# Patient Record
Sex: Male | Born: 1980 | Race: White | Hispanic: No | Marital: Single | Smoking: Current every day smoker
Health system: Southern US, Community
[De-identification: ages and names within clinical notes are randomized; demographics above are authoritative.]

## PROBLEM LIST (undated history)

## (undated) DIAGNOSIS — F419 Anxiety disorder, unspecified: Secondary | ICD-10-CM

## (undated) HISTORY — DX: Anxiety disorder, unspecified: F41.9

## (undated) HISTORY — PX: FRACTURE SURGERY: SHX138

---
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
## 2003-03-25 ENCOUNTER — Encounter: Admission: RE | Admit: 2003-03-25 | Discharge: 2003-03-25 | Payer: Self-pay | Admitting: Family Medicine

## 2003-03-25 ENCOUNTER — Encounter: Payer: Self-pay | Admitting: Family Medicine

## 2006-11-20 ENCOUNTER — Emergency Department (HOSPITAL_COMMUNITY): Admission: EM | Admit: 2006-11-20 | Discharge: 2006-11-20 | Payer: Self-pay | Admitting: Emergency Medicine

## 2006-11-29 ENCOUNTER — Ambulatory Visit (HOSPITAL_BASED_OUTPATIENT_CLINIC_OR_DEPARTMENT_OTHER): Admission: RE | Admit: 2006-11-29 | Discharge: 2006-11-29 | Payer: Self-pay | Admitting: Otolaryngology

## 2013-10-28 ENCOUNTER — Emergency Department (HOSPITAL_COMMUNITY): Payer: Self-pay

## 2013-10-28 ENCOUNTER — Encounter (HOSPITAL_COMMUNITY): Payer: Self-pay | Admitting: Emergency Medicine

## 2013-10-28 ENCOUNTER — Emergency Department (HOSPITAL_COMMUNITY)
Admission: EM | Admit: 2013-10-28 | Discharge: 2013-10-28 | Disposition: A | Discharge status: Home / Self Care | Payer: Self-pay | Attending: Emergency Medicine | Admitting: Emergency Medicine

## 2013-10-28 DIAGNOSIS — Z87891 Personal history of nicotine dependence: Secondary | ICD-10-CM | POA: Insufficient documentation

## 2013-10-28 DIAGNOSIS — R51 Headache: Secondary | ICD-10-CM | POA: Insufficient documentation

## 2013-10-28 DIAGNOSIS — H9209 Otalgia, unspecified ear: Secondary | ICD-10-CM | POA: Insufficient documentation

## 2013-10-28 DIAGNOSIS — Z8619 Personal history of other infectious and parasitic diseases: Secondary | ICD-10-CM | POA: Insufficient documentation

## 2013-10-28 DIAGNOSIS — N451 Epididymitis: Secondary | ICD-10-CM

## 2013-10-28 DIAGNOSIS — N453 Epididymo-orchitis: Secondary | ICD-10-CM | POA: Insufficient documentation

## 2013-10-28 MED ORDER — CEFTRIAXONE SODIUM 250 MG IJ SOLR
250.0000 mg | Freq: Once | INTRAMUSCULAR | Status: AC
  Administered 2013-10-28: 250 mg via INTRAMUSCULAR
  Filled 2013-10-28: qty 250

## 2013-10-28 MED ORDER — DOXYCYCLINE HYCLATE 100 MG PO CAPS: 100.0000 mg | ORAL_CAPSULE | Freq: Two times a day (BID) | ORAL | Status: DC

## 2013-10-28 NOTE — ED Notes (Signed)
Pt advised this RN that he has two week hx of bilateral ear pain, 1 day hx of pain and swelling in l/testicle

## 2013-10-28 NOTE — ED Provider Notes (Signed)
CSN: [id number]     Arrival date & time 10/28/13  1453 History  This chart was scribed for non-physician practitioner Santiago Glad, PA-C, working with Suzi Roots, MD by Dorothey Baseman, ED Scribe. This patient was seen in room WTR9/WTR9 and the patient's care was started at 3:47 PM.   Chief Complaint  Patient presents with  . Testicle Pain    l/testicle pain x1 day  . Otalgia   The history is provided by the patient. No language interpreter was used.   HPI Comments: Peter Escobar is a 32 y.o. male who presents to the Emergency Department complaining of a constant pain to the bilateral ears, left worse than right, onset about 1 week ago. He states that the pain is alleviated by tugging on the ears. He reports associated headache. He denies any ear drainage, congestion, fever, or chills.   Patient is also complaining of some swelling and pain to the left testicle with associated penile discharge onset yesterday that has been progressively worsening. He reports that the pain is exacerbated with movement. He denies abdominal pain. Denies nausea or vomiting.  Patient reports that he is not currently sexually active, but has been in the past. Patient has no other pertinent medical history.  History reviewed. No pertinent past medical history. History reviewed. No pertinent past surgical history. History reviewed. No pertinent family history. History  Substance Use Topics  . Smoking status: Former Games developer  . Smokeless tobacco: Not on file  . Alcohol Use: Yes     Comment: stopped heavy drinking one week ago    Review of Systems  A complete 10 system review of systems was obtained and all systems are negative except as noted in the HPI and PMH.   Allergies  Review of patient's allergies indicates no known allergies.  Home Medications  No current outpatient prescriptions on file.  Triage Vitals: BP 137/75  Pulse 79  Temp(Src) 98.2 F (36.8 C) (Oral)  Resp 16  SpO2 98%  Physical  Exam  Nursing note and vitals reviewed. Constitutional: He is oriented to person, place, and time. He appears well-developed and well-nourished. No distress.  HENT:  Head: Normocephalic and atraumatic.  Right Ear: Hearing, tympanic membrane, external ear and ear canal normal.  Left Ear: Hearing, tympanic membrane, external ear and ear canal normal.  Mouth/Throat: Oropharynx is clear and moist. No trismus in the jaw. No dental abscesses. No oropharyngeal exudate.  Eyes: Conjunctivae are normal.  Neck: Normal range of motion. Neck supple.  Cardiovascular: Normal rate, regular rhythm and normal heart sounds.  Exam reveals no gallop and no friction rub.   No murmur heard. Pulmonary/Chest: Effort normal and breath sounds normal. No respiratory distress. He has no wheezes. He has no rales.  Abdominal: Soft. Bowel sounds are normal. He exhibits no distension. There is no tenderness.  Genitourinary: Right testis shows no mass, no swelling and no tenderness. Left testis shows swelling and tenderness. Left testis shows no mass. Circumcised. No discharge found.  No penile discharge. Tenderness to palpation to the epididymis of the left scrotum. Mild, diffuse swelling of the left scrotum. No erythema.   Musculoskeletal: Normal range of motion.  Lymphadenopathy:       Right: No inguinal adenopathy present.       Left: No inguinal adenopathy present.  Neurological: He is alert and oriented to person, place, and time.  Skin: Skin is warm and dry.  Psychiatric: He has a normal mood and affect. His behavior is normal.  ED Course  Procedures (including critical care time)  DIAGNOSTIC STUDIES: Oxygen Saturation is 98% on room air, normal by my interpretation.    COORDINATION OF CARE: 3:51 PM- Will order an ultrasound due to concern for epididymitis. Will order testing for GC/chlamydia. Discussed treatment plan with patient at bedside and patient verbalized agreement.   5:25 PM- Discussed that  ultrasound confirms that symptoms are due to epididymitis. Will order an injection of Rocephin. Will discharge patient with doxycycline to manage symptoms. Advised patient to follow up with the referred urologist. Performed GC/chlamydia swab and discussed that he will be notified of the results in a few days. Discussed treatment plan with patient at bedside and patient verbalized agreement.    Labs Review Labs Reviewed  GC/CHLAMYDIA PROBE AMP   Imaging Review US Scrotum  10/28/2013   CLINICAL DATA:  Scrotal pain and swelling.  EXAM: SCROTAL ULTRASOUND  DOPPLER ULTRASOUND OF THE TESTICLES  TECHNIQUE: Complete ultrasound examination of the testicles, epididymis, and other scrotal structures was performed. Color and spectral Doppler ultrasound were also utilized to evaluate blood flow to the testicles.  COMPARISON:  None.  FINDINGS: Right testicle  Measurements: 4.1 x 2.1 x 2.3 cm. No mass or microlithiasis visualized.  Left testicle  Measurements: 4.1 x 2.1 x 2.6 cm. No mass or microlithiasis visualized.  Right epididymis: Normal in size without focal mass. Slight increased vascularity on color Doppler evaluation.  Left epididymis: Normal in size without focal mass. Increased vascularity on color Doppler evaluation.  Hydrocele:  None visualized.  Varicocele:  None visualized.  Pulsed Doppler interrogation of both testes demonstrates low resistance arterial and venous waveforms bilaterally.  IMPRESSION: 1. No evidence for testicular mass or torsion. 2. Increased epididymal blood flow, left greater than right, consistent with epididymo-orchitis.   Electronically Signed   By: Rosalie Gums M.D.   On: 10/28/2013 16:49   [place] Art/ven Flow Abd Pelv Doppler  10/28/2013   CLINICAL DATA:  Scrotal pain and swelling.  EXAM: SCROTAL ULTRASOUND  DOPPLER ULTRASOUND OF THE TESTICLES  TECHNIQUE: Complete ultrasound examination of the testicles, epididymis, and other scrotal structures was performed. Color and spectral Doppler  ultrasound were also utilized to evaluate blood flow to the testicles.  COMPARISON:  None.  FINDINGS: Right testicle  Measurements: 4.1 x 2.1 x 2.3 cm. No mass or microlithiasis visualized.  Left testicle  Measurements: 4.1 x 2.1 x 2.6 cm. No mass or microlithiasis visualized.  Right epididymis: Normal in size without focal mass. Slight increased vascularity on color Doppler evaluation.  Left epididymis: Normal in size without focal mass. Increased vascularity on color Doppler evaluation.  Hydrocele:  None visualized.  Varicocele:  None visualized.  Pulsed Doppler interrogation of both testes demonstrates low resistance arterial and venous waveforms bilaterally.  IMPRESSION: 1. No evidence for testicular mass or torsion. 2. Increased epididymal blood flow, left greater than right, consistent with epididymo-orchitis.   Electronically Signed   By: Rosalie Gums M.D.   On: 10/28/2013 16:49    EKG Interpretation   None       MDM  No diagnosis found. Patient presenting with ear pain and also scrotal pain and swelling.  No signs of ear infection on exam.  Scrotal ultrasound showing findings consistent with epididymo-orchitis.  GC/Chlamycia pending.  Patient treated with Ceftriaxone and Doxycycline.  Patient stable for discharge.  Return precautions given.  I personally performed the services described in this documentation, which was scribed in my presence. The recorded information has been reviewed and is  accurate.     Santiago Glad, PA-C 10/30/13 1153

## 2013-10-30 NOTE — ED Notes (Signed)
+   Chlamydia Patient treated with Rocephin And doxycyline-[facility] faxed

## 2013-10-30 NOTE — ED Provider Notes (Signed)
Medical screening examination/treatment/procedure(s) were performed by non-physician practitioner and as supervising physician I was immediately available for consultation/collaboration.  EKG Interpretation   None         Candyce Churn, MD 10/30/13 1316

## 2013-11-01 ENCOUNTER — Telehealth (HOSPITAL_COMMUNITY): Payer: Self-pay | Admitting: *Deleted

## 2013-11-01 NOTE — ED Notes (Signed)
Letter sent to [facility] address 

## 2015-04-12 ENCOUNTER — Emergency Department (INDEPENDENT_AMBULATORY_CARE_PROVIDER_SITE_OTHER): Payer: Self-pay

## 2015-04-12 ENCOUNTER — Emergency Department (INDEPENDENT_AMBULATORY_CARE_PROVIDER_SITE_OTHER)
Admission: EM | Admit: 2015-04-12 | Discharge: 2015-04-12 | Disposition: A | Discharge status: Home / Self Care | Payer: Self-pay | Source: Home / Self Care | Attending: Family Medicine | Admitting: Family Medicine

## 2015-04-12 ENCOUNTER — Encounter (HOSPITAL_COMMUNITY): Payer: Self-pay | Admitting: Family Medicine

## 2015-04-12 DIAGNOSIS — S92302A Fracture of unspecified metatarsal bone(s), left foot, initial encounter for closed fracture: Secondary | ICD-10-CM

## 2015-04-12 MED ORDER — DICLOFENAC SODIUM 75 MG PO TBEC: 75.0000 mg | DELAYED_RELEASE_TABLET | Freq: Two times a day (BID) | ORAL | Status: DC

## 2015-04-12 NOTE — ED Provider Notes (Addendum)
CSN: [id number]     Arrival date & time 04/12/15  1606 History   First MD Initiated Contact with Patient 04/12/15 1615     Chief Complaint  Patient presents with  . Foot Pain   (Consider location/radiation/quality/duration/timing/severity/associated sxs/prior Treatment) HPI  L foot pain. Started 1 day ago. Top of foot. Stepped over a whole and foot went into a hole. Felt cracking and popping. Painful to walk. Ibuprofen w/ some benefit. Ice w/ some improvement. Pain is constant. No change since this morning.   Denies head trauma, LOC, lightheadedness, chest pain, palpitations, shortness of breath, other joint effusions. Sensation and movement intact in left foot. Right foot unaffected.   History reviewed. No pertinent past medical history. History reviewed. No pertinent past surgical history. Family History  Problem Relation Age of Onset  . Cancer Other    History  Substance Use Topics  . Smoking status: Former Games developermoker  . Smokeless tobacco: Not on file  . Alcohol Use: Yes     Comment: stopped heavy drinking one week ago    Review of Systems Per HPI with all other pertinent systems negative.   Allergies  Review of patient's allergies indicates no known allergies.  Home Medications   Prior to Admission medications   Not on File   BP 133/79 mmHg  Pulse 69  Temp(Src) 98 F (36.7 C) (Oral)  Resp 16  SpO2 98% Physical Exam Physical Exam  Constitutional: oriented to person, place, and time. appears well-developed and well-nourished. No distress.  HENT:  Head: Normocephalic and atraumatic.  Eyes: EOMI. PERRL.  Neck: Normal range of motion.  Cardiovascular: RRR, no m/r/g, 2+ distal pulses,  Pulmonary/Chest: Effort normal and breath sounds normal. No respiratory distress.  Abdominal: Soft. Bowel sounds are normal. NonTTP, no distension.  Musculoskeletal: Mild swelling along the proximal dorsum of the left foot with tenderness to palpation. No ankle laxity and no bony  abnormality noted. Full range of motion of the feet bilaterally. Pedal pulses intact.  Neurological: alert and oriented to person, place, and time.  Skin: Skin is warm. No rash noted. non diaphoretic.  Psychiatric: normal mood and affect. behavior is normal. Judgment and thought content normal.   ED Course  Procedures (including critical care time) Labs Review Labs Reviewed - No data to display  Imaging Review Dg Foot Complete Left  04/12/2015   CLINICAL DATA:  Left lateral foot pain secondary to an injury yesterday.  EXAM: LEFT FOOT - COMPLETE 3+ VIEW  COMPARISON:  None.  FINDINGS: There is a nondisplaced fracture of the base of the fifth metatarsal. The other bones are normal.  IMPRESSION: Nondisplaced fracture of the base of the fifth metatarsal.   Electronically Signed   By: Francene BoyersJames  Maxwell M.D.   On: 04/12/2015 17:12     MDM   1. Fracture of 5th metatarsal, left, closed, initial encounter    Fracture as noted above. Cam Walker boot, Voltaren. [facility]. Patient to call Monday to schedule appointment.    [name], MD 04/12/15 1752  [name], MD 04/12/15 [id number]

## 2015-04-12 NOTE — Discharge Instructions (Signed)
You broke your fifth metatarsal. Please wear the boot 24's 7 unless taking a shower. Please call orthopedic surgery on Monday to schedule an appointment within 1 week. Please use ibuprofen 600 mg every 6 hours or the Voltaren for pain and inflammation.

## 2015-04-12 NOTE — ED Notes (Signed)
Left foot pain. Pt was walking last outside last pm and stepped in hole and hurt foot.

## 2016-02-09 ENCOUNTER — Ambulatory Visit (INDEPENDENT_AMBULATORY_CARE_PROVIDER_SITE_OTHER): Payer: 59 | Admitting: Family Medicine

## 2016-02-09 ENCOUNTER — Other Ambulatory Visit: Payer: Self-pay | Admitting: *Deleted

## 2016-02-09 VITALS — BP 110/78 | HR 66 | Temp 98.0°F | Resp 16

## 2016-02-09 DIAGNOSIS — M67432 Ganglion, left wrist: Secondary | ICD-10-CM | POA: Diagnosis not present

## 2016-02-09 DIAGNOSIS — R1310 Dysphagia, unspecified: Secondary | ICD-10-CM

## 2016-02-09 DIAGNOSIS — K921 Melena: Secondary | ICD-10-CM

## 2016-02-09 DIAGNOSIS — H538 Other visual disturbances: Secondary | ICD-10-CM | POA: Diagnosis not present

## 2016-02-09 NOTE — Progress Notes (Signed)
By signing my name below, I, Stann Ore, attest that this documentation has been prepared under the direction and in the presence of Elvina Sidle, MD. Electronically Signed: Stann Ore, Scribe. 02/09/2016 , 5:45 PM .  Patient was seen in room 12 .   Patient ID: Peter Escobar MRN: [id number], DOB: 1981-09-10, 35 y.o. Date of Encounter: 02/09/2016  Primary Physician: No primary care provider on file.  Chief Complaint:  Chief Complaint  Patient presents with   blood in stool   cyst on left wrist   Blurred Vision    HPI:  Peter Escobar is a 35 y.o. male who presents to [facility] complaining of intermittent blood in stool with some abdominal cramping within 9-10 months. He's been reading online and became concerned. He wants a colonoscopy to be checked. He also notes taking more time with bowel movements. His uncle has Crohn's disease.   He also has some reflux noticed after eating steak and chicken.   He reports having a cyst over his left wrist. He accidentally bumped the area while at work, and it drained out. It's been coming back recently.   He's had history of breaking several facial bones. He's been having some blurred vision for a few months now.   He reports smoking and alcohol use.  He works as a Location manager.   Past Medical History  Diagnosis Date   Anxiety      Home Meds: Prior to Admission medications   Medication Sig Start Date End Date Taking? Authorizing Provider  diclofenac (VOLTAREN) 75 MG EC tablet Take 1 tablet (75 mg total) by mouth 2 (two) times daily. Patient not taking: Reported on 02/09/2016 04/12/15   Ozella Rocks, MD    Allergies: No Known Allergies  Social History   Social History   Marital Status: Single    Spouse Name: N/A   Number of Children: N/A   Years of Education: N/A   Occupational History   Not on file.   Social History Main Topics   Smoking status: Former Smoker   Smokeless  tobacco: Not on file   Alcohol Use: Yes     Comment: stopped heavy drinking one week ago   Drug Use: Not on file   Sexual Activity: Not on file   Other Topics Concern   Not on file   Social History Narrative     Review of Systems: Constitutional: negative for fever, chills, night sweats, weight changes, or fatigue  HEENT: negative for hearing loss, congestion, rhinorrhea, ST, epistaxis, or sinus pressure; positive for blurred vision Cardiovascular: negative for chest pain or palpitations Respiratory: negative for hemoptysis, wheezing, shortness of breath, or cough Abdominal: negative for abdominal pain, nausea, vomiting, diarrhea, or constipation; positive for blood in stool Dermatological: negative for rash; positive for cyst over left wrist Neurologic: negative for headache, dizziness, or syncope All other systems reviewed and are otherwise negative with the exception to those above and in the HPI.  Physical Exam:  Blood pressure 110/78, pulse 66, temperature 98 F (36.7 C), temperature source Oral, resp. rate 16, SpO2 98 %., There is no height or weight on file to calculate BMI. General: Well developed, well nourished, in no acute distress. Head: Normocephalic, atraumatic, eyes without discharge, sclera non-icteric, nares are without discharge. Bilateral auditory canals clear, TM's are without perforation, pearly grey and translucent with reflective cone of light bilaterally. Oral cavity moist, posterior pharynx without exudate, erythema, peritonsillar abscess, or post nasal drip.  Neck:  Supple. No thyromegaly. Full ROM. No lymphadenopathy. Lungs: Clear bilaterally to auscultation without wheezes, rales, or rhonchi. Breathing is unlabored. Heart: RRR with S1 S2. No murmurs, rubs, or gallops appreciated. Abdomen: Soft, non-tender, non-distended with normoactive bowel sounds. No hepatomegaly. No rebound/guarding. No obvious abdominal masses. Msk:  Strength and tone normal for  age. Extremities/Skin: Warm and dry. left dorsal wrist: 0.5cm hard nodule that is nontender consistent with ganglion cyst GU: rectal exam show no abnormalities Neuro: Alert and oriented X 3. Moves all extremities spontaneously. Gait is normal. CNII-XII grossly in tact. Psych:  Responds to questions appropriately with a normal affect.   Labs:  ASSESSMENT AND PLAN:  35 y.o. year old male with  This chart was scribed in my presence and reviewed by me personally.    ICD-9-CM ICD-10-CM   1. Hematochezia 578.1 K92.1 POCT SEDIMENTATION RATE     POCT CBC     Ambulatory referral to Gastroenterology  2. Dysphagia 787.20 R13.10 POCT SEDIMENTATION RATE     Ambulatory referral to Gastroenterology  3. Ganglion cyst of wrist, left 727.41 M67.432 Ambulatory referral to Hand Surgery  4. Blurry vision 368.8 H53.8 COMPLETE METABOLIC PANEL WITH GFR     POCT glycosylated hemoglobin (Hb A1C)      Signed, Elvina SidleKurt Lauenstein, MD 02/09/2016 5:45 PM

## 2016-02-09 NOTE — Addendum Note (Signed)
Addended by: Thelma BargeICHARDSON, Adiel Erney D on: 02/09/2016 06:03 PM   Modules accepted: Orders

## 2016-02-09 NOTE — Patient Instructions (Addendum)
We are referring you to a gastroenterologist to discuss the blood in stool to make sure you do not have ulcerative colitis.  I'm referring you to a hand surgeon regarding the ganglion cyst on her left wrist.  I am running several tests to make sure do not of diabetes or other significant problem leading to the blurry vision  I want you to take Nexium daily for the next week to stop the difficulty swallowing. He can discuss this with the gastroenterologist when you see him.  IF you received an x-ray today, you will receive an invoice from [facility]. Please contact [facility] at [phone] with questions or concerns regarding your invoice.   IF you received labwork today, you will receive an invoice from [facility]. Please contact [facility] at [phone] with questions or concerns regarding your invoice.   Our billing staff will not be able to assist you with questions regarding bills from these companies.  You will be contacted with the lab results as soon as they are available. The fastest way to get your results is to activate your My Chart account. Instructions are located on the last page of this paperwork. If you have not heard from us regarding the results in 2 weeks, please contact this office.

## 2016-02-09 NOTE — Addendum Note (Signed)
Addended by: Thelma BargeICHARDSON, Brigida Scotti D on: 02/09/2016 06:48 PM   Modules accepted: Orders

## 2016-10-25 ENCOUNTER — Emergency Department (HOSPITAL_COMMUNITY): Payer: 59

## 2016-10-25 ENCOUNTER — Emergency Department (HOSPITAL_COMMUNITY)
Admission: EM | Admit: 2016-10-25 | Discharge: 2016-10-25 | Disposition: A | Discharge status: Home / Self Care | Payer: 59 | Attending: Emergency Medicine | Admitting: Emergency Medicine

## 2016-10-25 ENCOUNTER — Encounter (HOSPITAL_COMMUNITY): Payer: Self-pay

## 2016-10-25 DIAGNOSIS — F10920 Alcohol use, unspecified with intoxication, uncomplicated: Secondary | ICD-10-CM

## 2016-10-25 DIAGNOSIS — Z79899 Other long term (current) drug therapy: Secondary | ICD-10-CM | POA: Diagnosis not present

## 2016-10-25 DIAGNOSIS — W130XXA Fall from, out of or through balcony, initial encounter: Secondary | ICD-10-CM | POA: Diagnosis not present

## 2016-10-25 DIAGNOSIS — Y939 Activity, unspecified: Secondary | ICD-10-CM | POA: Insufficient documentation

## 2016-10-25 DIAGNOSIS — S32048A Other fracture of fourth lumbar vertebra, initial encounter for closed fracture: Secondary | ICD-10-CM | POA: Insufficient documentation

## 2016-10-25 DIAGNOSIS — F10120 Alcohol abuse with intoxication, uncomplicated: Secondary | ICD-10-CM | POA: Diagnosis not present

## 2016-10-25 DIAGNOSIS — S3992XA Unspecified injury of lower back, initial encounter: Secondary | ICD-10-CM | POA: Diagnosis present

## 2016-10-25 DIAGNOSIS — R55 Syncope and collapse: Secondary | ICD-10-CM | POA: Insufficient documentation

## 2016-10-25 DIAGNOSIS — Y929 Unspecified place or not applicable: Secondary | ICD-10-CM | POA: Diagnosis not present

## 2016-10-25 DIAGNOSIS — Z87891 Personal history of nicotine dependence: Secondary | ICD-10-CM | POA: Insufficient documentation

## 2016-10-25 DIAGNOSIS — Y999 Unspecified external cause status: Secondary | ICD-10-CM | POA: Diagnosis not present

## 2016-10-25 DIAGNOSIS — S32000A Wedge compression fracture of unspecified lumbar vertebra, initial encounter for closed fracture: Secondary | ICD-10-CM

## 2016-10-25 DIAGNOSIS — M79673 Pain in unspecified foot: Secondary | ICD-10-CM

## 2016-10-25 LAB — CBC WITH DIFFERENTIAL/PLATELET
Basophils Absolute: 0 10*3/uL (ref 0.0–0.1)
Basophils Relative: 0 %
Eosinophils Absolute: 0.2 10*3/uL (ref 0.0–0.7)
Eosinophils Relative: 2 %
HEMATOCRIT: 43.6 % (ref 39.0–52.0)
HEMOGLOBIN: 15 g/dL (ref 13.0–17.0)
LYMPHS ABS: 1.9 10*3/uL (ref 0.7–4.0)
LYMPHS PCT: 19 %
MCH: 31.6 pg (ref 26.0–34.0)
MCHC: 34.4 g/dL (ref 30.0–36.0)
MCV: 92 fL (ref 78.0–100.0)
Monocytes Absolute: 0.9 10*3/uL (ref 0.1–1.0)
Monocytes Relative: 9 %
NEUTROS ABS: 7 10*3/uL (ref 1.7–7.7)
NEUTROS PCT: 70 %
Platelets: 261 10*3/uL (ref 150–400)
RBC: 4.74 MIL/uL (ref 4.22–5.81)
RDW: 12.9 % (ref 11.5–15.5)
WBC: 9.9 10*3/uL (ref 4.0–10.5)

## 2016-10-25 LAB — BASIC METABOLIC PANEL
ANION GAP: 9 (ref 5–15)
BUN: 15 mg/dL (ref 6–20)
CHLORIDE: 108 mmol/L (ref 101–111)
CO2: 23 mmol/L (ref 22–32)
Calcium: 8.7 mg/dL — ABNORMAL LOW (ref 8.9–10.3)
Creatinine, Ser: 1.25 mg/dL — ABNORMAL HIGH (ref 0.61–1.24)
GFR calc non Af Amer: >60 mL/min (ref >60)
Glucose, Bld: 92 mg/dL (ref 65–99)
Potassium: 3.9 mmol/L (ref 3.5–5.1)
SODIUM: 140 mmol/L (ref 135–145)

## 2016-10-25 LAB — ETHANOL: Alcohol, Ethyl (B): 191 mg/dL — ABNORMAL HIGH (ref <5)

## 2016-10-25 MED ORDER — KETOROLAC TROMETHAMINE 30 MG/ML IJ SOLN
30.0000 mg | Freq: Once | INTRAMUSCULAR | Status: AC
  Administered 2016-10-25: 30 mg via INTRAVENOUS
  Filled 2016-10-25: qty 1

## 2016-10-25 MED ORDER — NAPROXEN 500 MG PO TABS
500.0000 mg | ORAL_TABLET | Freq: Two times a day (BID) | ORAL | 0 refills | Status: DC
Start: 2016-10-25 — End: 2018-06-04

## 2016-10-25 MED ORDER — TRAMADOL HCL 50 MG PO TABS: 50.0000 mg | ORAL_TABLET | Freq: Four times a day (QID) | ORAL | 0 refills | Status: DC | PRN
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
## 2016-10-25 NOTE — ED Notes (Signed)
Pt uncooperative and refusing to rate pain.

## 2016-10-25 NOTE — ED Notes (Signed)
Patient transported to CT 

## 2016-10-25 NOTE — ED Notes (Signed)
Pt stood and took a few steps without difficulty
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
## 2016-10-31 ENCOUNTER — Ambulatory Visit (INDEPENDENT_AMBULATORY_CARE_PROVIDER_SITE_OTHER): Payer: 59

## 2016-10-31 ENCOUNTER — Ambulatory Visit (INDEPENDENT_AMBULATORY_CARE_PROVIDER_SITE_OTHER): Payer: 59 | Admitting: Family Medicine

## 2016-10-31 DIAGNOSIS — R0789 Other chest pain: Secondary | ICD-10-CM

## 2016-10-31 DIAGNOSIS — S32040D Wedge compression fracture of fourth lumbar vertebra, subsequent encounter for fracture with routine healing: Secondary | ICD-10-CM | POA: Diagnosis not present

## 2016-10-31 NOTE — Progress Notes (Signed)
Subjective:  By signing my name below, I, Stann Ore, attest that this documentation has been prepared under the direction and in the presence of Meredith Staggers, MD. Electronically Signed: Stann Ore, Scribe. 10/31/2016 , 4:02 PM .  Patient was seen in Room 9 .   Patient ID: Peter Escobar, male    DOB: [birth year], 35 y.o.   MRN: [id number] Chief Complaint  Patient presents with  . Other    Pt would like to have more days off work due to injury.    HPI Peter Escobar is a 35 y.o. male  Patient was seen in [facility] on Nov 28th, for a fall and subsequent back pain. He had jumped off a balcony that was 2 stories up. He had possible LOC, and alcohol consumed at the time. He had primary issue of low back pain. He had subtle compression fracture seen on CT scan of L-spine over L4. His T-spine CT was without fracture. And CT C-spine and head were negative for acute intracranial or cervical spine injury. He was able to ambulate without problem in the ER. He was treated with tramadol and naproxen. He was to follow up with Dr. Eulah Pont at [facility] orthopedics.   Patient denies knowledge to follow up with Dr. Eulah Pont. He thought he had to follow up with a PCP, and tried to make an appointment at [facility]. He reports not filling tramadol yet, as he had muscle relaxants from home and 2 tablets of ibuprofen 4~5 times a day. He noticed some improvement yesterday so he's now taking 2 tablets of ibuprofen 3 times a day.   He mentions having some chest wall pain a few days after his ER visit. He notices it more when turning, located over the sternal area. He had chest xray in the ER which showed an old deformity of the posterior lateral aspect of left 4th rib. He also notes right foot pain, but he had xray done in ER which showed negative right foot xray. He denies hematuria, stool or urinary incontinence.   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   There are no active problems to display for this patient.  Past Medical History:  Diagnosis Date  . Anxiety    Past Surgical History:  Procedure Laterality Date  . FRACTURE SURGERY     No Known Allergies Prior to Admission medications   Medication Sig Start Date End Date Taking? Authorizing Provider  ibuprofen (ADVIL,MOTRIN) 600 MG tablet Take 600 mg by mouth every 6 (six) hours as needed.   Yes Historical Provider, MD  diclofenac (VOLTAREN) 75 MG EC tablet Take 1 tablet (75 mg total) by mouth 2 (two) times daily. Patient not taking: Reported on 10/31/2016 04/12/15   Ozella Rocks, MD  naproxen (NAPROSYN) 500 MG tablet Take 1 tablet (500 mg total) by mouth 2 (two) times daily. Patient not taking: Reported on 10/31/2016 10/25/16   Rolan Bucco, MD  traMADol (ULTRAM) 50 MG tablet Take 1 tablet (50 mg total) by mouth every 6 (six) hours as needed. Patient not taking: Reported on 10/31/2016 10/25/16   Rolan Bucco, MD   Social History   Social History  . Marital status: Single    Spouse name: N/A  . Number of children: N/A  . Years of education: N/A   Occupational History  . Not on file.   Social History Main Topics  . Smoking status: Former Games developer  . Smokeless tobacco: Never  Used  . Alcohol use Yes     Comment: stopped heavy drinking one week ago  . Drug use: Unknown  . Sexual activity: Not on file   Other Topics Concern  . Not on file   Social History Narrative  . No narrative on file   Review of Systems  Constitutional: Negative for fatigue and unexpected weight change.  Eyes: Negative for visual disturbance.  Respiratory: Negative for cough, chest tightness and shortness of breath.   Cardiovascular: Positive for chest pain. Negative for palpitations and leg swelling.  Gastrointestinal: Negative for abdominal pain and blood in stool.  Genitourinary: Negative for hematuria.  Musculoskeletal: Positive for back pain, gait problem and myalgias.    Neurological: Negative for dizziness, light-headedness and headaches.       Objective:   Physical Exam  Constitutional: He is oriented to person, place, and time. He appears well-developed and well-nourished. No distress.  HENT:  Head: Normocephalic and atraumatic.  Eyes: EOM are normal. Pupils are equal, round, and reactive to light.  Neck: Neck supple.  Cardiovascular: Normal rate.   Pulmonary/Chest: Effort normal. No respiratory distress.  Chest wall: skin intact, no subcutaneous emphysema, tender over lower sternal border and right sided chest wall; tender along right sided chest wall mid axillary line  Musculoskeletal: Normal range of motion.  Tender along lower lumbar spine and paraspinals, guarded with heel walk on the right, but strength intact  Neurological: He is alert and oriented to person, place, and time.  Skin: Skin is warm and dry.  Psychiatric: He has a normal mood and affect. His behavior is normal.  Nursing note and vitals reviewed.   Vitals:   10/31/16 1459  BP: 124/82  Pulse: 99  Resp: 17  Temp: 98.4 F (36.9 C)  TempSrc: Oral  SpO2: 99%  Weight: 223 lb (101.2 kg)  Height: 5\' 9"  (1.753 m)   Dg Ribs Unilateral W/chest Right  Result Date: 10/31/2016 CLINICAL DATA:  Fall. EXAM: RIGHT RIBS AND CHEST - 3+ VIEW COMPARISON:  10/31/2016. FINDINGS: No evidence of displaced rib fracture or pneumothorax. No acute cardiopulmonary disease identified . IMPRESSION: No evidence of displaced rib fracture or pneumothorax. Electronically Signed   By: Maisie Fushomas  Register   On: 10/31/2016 16:20   Dg Sternum  Result Date: 10/31/2016 CLINICAL DATA:  Sternal pain.  Fall 15 to 20 feet 10/25/2016 EXAM: STERNUM - 2+ VIEW COMPARISON:  None. FINDINGS: There is no evidence of fracture or other focal bone lesions. IMPRESSION: Negative. Electronically Signed   By: Marlan Palauharles  Clark M.D.   On: 10/31/2016 16:19       Assessment & Plan:    Peter Escobar is a 35 y.o. male Closed  compression fracture of L4 lumbar vertebra with routine healing, subsequent encounter - Plan: Ambulatory referral to Orthopedic Surgery  Sternal pain - Plan: DG Sternum, Ambulatory referral to Orthopedic Surgery  Right-sided chest wall pain - Plan: DG Ribs Unilateral W/Chest Right  Injury as above, initial evaluation ER with lumbar L4 compression fracture. No apparent fractures otherwise. X-rays of sternum and right rib series without apparent fractures in office. Suspected rib contusion. Also prior foot pain, but negative x-ray. Due to compression fracture lumbar spine and persistent pain will follow-up with orthopedics as initially planned. Appointment was obtained for him on Wednesday 11/02/16. Continue NSAIDs for now, has tramadol prescription if needed. Additional work note given until evaluated by orthopedics, RTC precautions.  Meds ordered this encounter  Medications  . ibuprofen (ADVIL,MOTRIN) 600 MG tablet  Sig: Take 600 mg by mouth every 6 (six) hours as needed.   Patient Instructions    For chest wall, back, and rib pain can take either the naproxen that was prescribed in the emergency room, or ibuprofen up to 600 mg every 6 hours as needed with food. Follow-up with orthopedics this Wednesday morning to determine next step in the treatment of your compression fracture. I did write a note out of work for the next 2 days, but you may be able to return to seated work at the discretion of orthopedist. There were no signs of fracture on the sternum x-ray or the rib x-rays on the right. Return to the clinic or go to the nearest emergency room if any of your symptoms worsen or new symptoms occur.   Vertebral Fracture A vertebral fracture is a break in one of the bones that make up the spine (vertebrae). The vertebrae are stacked on top of each other to form the spinal column. They support the body and protect the spinal cord. The vertebral column has an upper part (cervical spine), a middle  part (thoracic spine), and a lower part (lumbar spine). Most vertebral fractures occur in the thoracic spine or lumbar spine. There are three main types of vertebral fractures:  Flexion fracture. This happens when vertebrae collapse. Vertebrae can collapse:  In the front (compression fracture). This type of fracture is common in people who have a condition that causes their bones to be weak and brittle (osteoporosis). The fracture can make a person lose height.  In the front and back (axial burst fracture).  Extension fracture. This happens when an external force pulls apart the vertebrae.  Rotation fracture. This happens when the spine bends extremely in one direction. This type can cause a piece of a vertebra to break off (transverse process fracture) or move out of its normal position (fracture dislocation). This type of fracture has a high risk for spinal cord injury. Vertebral fractures can range from mild to very severe. The most severe types are those that cause the broken bones to move out of place (unstable) and those that injure or press on the spinal cord. What are the causes? This condition is usually caused by a forceful injury. This type of injury commonly results from:  Car accidents.  Falling or jumping from a great height.  Collisions in contact sports.  Violent acts, such as an assault or a gunshot wound. What increases the risk? This injury is more likely to happen to people who:  Have osteoporosis.  Participate in contact sports.  Are in situations that could result in falls or other violent injuries. What are the signs or symptoms? Symptoms of this injury depend on the location and the type of fracture. The most common symptom is back pain that gets worse with movement. You may also have trouble standing or walking. If a fracture has damaged your spinal cord or is pressing on it, you may also have:  Numbness.  Tingling.  Weakness.  Loss of  movement.  Loss of bowel or bladder control. How is this diagnosed? This injury may be diagnosed based on symptoms, medical history, and a physical exam. You may also have imaging tests to confirm the diagnosis. These may include:  Spine X-ray.  CT scan.  MRI. How is this treated? Treatment for this injury depends on the type of fracture. If your fracture is stable and does not affect your spinal cord, it may heal with nonsurgical treatment, such  as:  Taking pain medicine.  Wearing a cast or a brace.  Doing physical therapy exercises. If your vertebral fracture is unstable or it affects your spinal cord, you may need surgical treatment, such as:  Laminectomy. This procedure involves removing the part of a vertebra that is pushing on the spinal cord (spinal decompression surgery). Bone fragments may also be removed.  Spinal fusion. This procedure is used to stabilize an unstable fracture. Vertebrae may be joined together with a piece of bone from another part of your body (graft) and held in place with rods, plates, or screws.  Vertebroplasty. In this procedure, bone cement is used to rebuild collapsed vertebrae. Follow these instructions at home: General instructions  Take medicines only as directed by your health care provider.  Do not drive or operate heavy machinery while taking pain medicine.  If directed, apply ice to the injured area:  Put ice in a plastic bag.  Place a towel between your skin and the bag.  Leave the ice on for 30 minutes every two hours at first. Then apply the ice as needed.  Wear your neck brace or back brace as directed by your health care provider.  Do not drink alcohol. Alcohol can interfere with your treatment.  Keep all follow-up visits as directed by your health care provider. This is important. It can help to prevent permanent injury, disability, and long-lasting (chronic) pain. Activity  Stay in bed (on bed rest) only as directed by  your health care provider. Being on bed rest for too long can make your condition worse.  Return to your normal activities as directed by your health care provider. Ask what activities are safe for you.  Do exercises to improve motion and strength in your back (physical therapy), as recommended by your health care provider.  Exercise regularly as directed by your health care provider. Contact a health care provider if:  You have a fever.  You develop a cough that makes your pain worse.  Your pain medicine is not helping.  Your pain does not get better over time.  You cannot return to your normal activities as planned or expected. Get help right away if:  Your pain is very bad and it suddenly gets worse.  You are unable to move any body part (paralysis) that is below the level of your injury.  You have numbness, tingling, or weakness in any body part that is below the level of your injury.  You cannot control your bladder or bowels. This information is not intended to replace advice given to you by your health care provider. Make sure you discuss any questions you have with your health care provider. Document Released: 12/22/2004 Document Revised: 04/27/2016 Document Reviewed: 11/19/2014 Elsevier Interactive Patient Education  2017 [employer]vier Inc.  Chest Wall Pain Chest wall pain is pain in or around the bones and muscles of your chest. Sometimes, an injury causes this pain. Sometimes, the cause may not be known. This pain may take several weeks or longer to get better. Follow these instructions at home: Pay attention to any changes in your symptoms. Take these actions to help with your pain:  Rest as told by your health care provider.  Avoid activities that cause pain. These include any activities that use your chest muscles or your abdominal and side muscles to lift heavy items.  If directed, apply ice to the painful area:  Put ice in a plastic bag.  Place a towel between  your skin and  the bag.  Leave the ice on for 20 minutes, 2-3 times per day.  Take over-the-counter and prescription medicines only as told by your health care provider.  Do not use tobacco products, including cigarettes, chewing tobacco, and e-cigarettes. If you need help quitting, ask your health care provider.  Keep all follow-up visits as told by your health care provider. This is important. Contact a health care provider if:  You have a fever.  Your chest pain becomes worse.  You have new symptoms. Get help right away if:  You have nausea or vomiting.  You feel sweaty or light-headed.  You have a cough with phlegm (sputum) or you cough up blood.  You develop shortness of breath. This information is not intended to replace advice given to you by your health care provider. Make sure you discuss any questions you have with your health care provider. Document Released: 11/14/2005 Document Revised: 03/24/2016 Document Reviewed: 02/09/2015 Elsevier Interactive Patient Education  2017 [employer]   IF you received an x-ray today, you will receive an invoice from [facility]. Please contact [facility] at [phone] with questions or concerns regarding your invoice.   IF you received labwork today, you will receive an invoice from [facility]. Please contact [facility] at [phone] with questions or concerns regarding your invoice.   Our billing staff will not be able to assist you with questions regarding bills from these companies.  You will be contacted with the lab results as soon as they are available. The fastest way to get your results is to activate your My Chart account. Instructions are located on the last page of this paperwork. If you have not heard from us regarding the results in 2 weeks, please contact this office.     You have an appointment Wednesday 11/02/2016 at 8:45 am at [facility]  [address], [place], [place] [address] [phone]                          .Teod[facility] Medic[facility]gsac

## 2016-10-31 NOTE — Patient Instructions (Addendum)
For chest wall, back, and rib pain can take either the naproxen that was prescribed in the emergency room, or ibuprofen up to 600 mg every 6 hours as needed with food. Follow-up with orthopedics this Wednesday morning to determine next step in the treatment of your compression fracture. I did write a note out of work for the next 2 days, but you may be able to return to seated work at the discretion of orthopedist. There were no signs of fracture on the sternum x-ray or the rib x-rays on the right. Return to the clinic or go to the nearest emergency room if any of your symptoms worsen or new symptoms occur.   Vertebral Fracture A vertebral fracture is a break in one of the bones that make up the spine (vertebrae). The vertebrae are stacked on top of each other to form the spinal column. They support the body and protect the spinal cord. The vertebral column has an upper part (cervical spine), a middle part (thoracic spine), and a lower part (lumbar spine). Most vertebral fractures occur in the thoracic spine or lumbar spine. There are three main types of vertebral fractures:  Flexion fracture. This happens when vertebrae collapse. Vertebrae can collapse:  In the front (compression fracture). This type of fracture is common in people who have a condition that causes their bones to be weak and brittle (osteoporosis). The fracture can make a person lose height.  In the front and back (axial burst fracture).  Extension fracture. This happens when an external force pulls apart the vertebrae.  Rotation fracture. This happens when the spine bends extremely in one direction. This type can cause a piece of a vertebra to break off (transverse process fracture) or move out of its normal position (fracture dislocation). This type of fracture has a high risk for spinal cord injury. Vertebral fractures can range from mild to very severe. The most severe types are those that cause the broken bones to move out of  place (unstable) and those that injure or press on the spinal cord. What are the causes? This condition is usually caused by a forceful injury. This type of injury commonly results from:  Car accidents.  Falling or jumping from a great height.  Collisions in contact sports.  Violent acts, such as an assault or a gunshot wound. What increases the risk? This injury is more likely to happen to people who:  Have osteoporosis.  Participate in contact sports.  Are in situations that could result in falls or other violent injuries. What are the signs or symptoms? Symptoms of this injury depend on the location and the type of fracture. The most common symptom is back pain that gets worse with movement. You may also have trouble standing or walking. If a fracture has damaged your spinal cord or is pressing on it, you may also have:  Numbness.  Tingling.  Weakness.  Loss of movement.  Loss of bowel or bladder control. How is this diagnosed? This injury may be diagnosed based on symptoms, medical history, and a physical exam. You may also have imaging tests to confirm the diagnosis. These may include:  Spine X-ray.  CT scan.  MRI. How is this treated? Treatment for this injury depends on the type of fracture. If your fracture is stable and does not affect your spinal cord, it may heal with nonsurgical treatment, such as:  Taking pain medicine.  Wearing a cast or a brace.  Doing physical therapy exercises. If your vertebral  fracture is unstable or it affects your spinal cord, you may need surgical treatment, such as:  Laminectomy. This procedure involves removing the part of a vertebra that is pushing on the spinal cord (spinal decompression surgery). Bone fragments may also be removed.  Spinal fusion. This procedure is used to stabilize an unstable fracture. Vertebrae may be joined together with a piece of bone from another part of your body (graft) and held in place with  rods, plates, or screws.  Vertebroplasty. In this procedure, bone cement is used to rebuild collapsed vertebrae. Follow these instructions at home: General instructions  Take medicines only as directed by your health care provider.  Do not drive or operate heavy machinery while taking pain medicine.  If directed, apply ice to the injured area:  Put ice in a plastic bag.  Place a towel between your skin and the bag.  Leave the ice on for 30 minutes every two hours at first. Then apply the ice as needed.  Wear your neck brace or back brace as directed by your health care provider.  Do not drink alcohol. Alcohol can interfere with your treatment.  Keep all follow-up visits as directed by your health care provider. This is important. It can help to prevent permanent injury, disability, and long-lasting (chronic) pain. Activity  Stay in bed (on bed rest) only as directed by your health care provider. Being on bed rest for too long can make your condition worse.  Return to your normal activities as directed by your health care provider. Ask what activities are safe for you.  Do exercises to improve motion and strength in your back (physical therapy), as recommended by your health care provider.  Exercise regularly as directed by your health care provider. Contact a health care provider if:  You have a fever.  You develop a cough that makes your pain worse.  Your pain medicine is not helping.  Your pain does not get better over time.  You cannot return to your normal activities as planned or expected. Get help right away if:  Your pain is very bad and it suddenly gets worse.  You are unable to move any body part (paralysis) that is below the level of your injury.  You have numbness, tingling, or weakness in any body part that is below the level of your injury.  You cannot control your bladder or bowels. This information is not intended to replace advice given to you by  your health care provider. Make sure you discuss any questions you have with your health care provider. Document Released: 12/22/2004 Document Revised: 04/27/2016 Document Reviewed: 11/19/2014 Elsevier Interactive Patient Education  2017 [employer]  Chest Wall Pain Chest wall pain is pain in or around the bones and muscles of your chest. Sometimes, an injury causes this pain. Sometimes, the cause may not be known. This pain may take several weeks or longer to get better. Follow these instructions at home: Pay attention to any changes in your symptoms. Take these actions to help with your pain:  Rest as told by your health care provider.  Avoid activities that cause pain. These include any activities that use your chest muscles or your abdominal and side muscles to lift heavy items.  If directed, apply ice to the painful area:  Put ice in a plastic bag.  Place a towel between your skin and the bag.  Leave the ice on for 20 minutes, 2-3 times per day.  Take over-the-counter and prescription medicines  only as told by your health care provider.  Do not use tobacco products, including cigarettes, chewing tobacco, and e-cigarettes. If you need help quitting, ask your health care provider.  Keep all follow-up visits as told by your health care provider. This is important. Contact a health care provider if:  You have a fever.  Your chest pain becomes worse.  You have new symptoms. Get help right away if:  You have nausea or vomiting.  You feel sweaty or light-headed.  You have a cough with phlegm (sputum) or you cough up blood.  You develop shortness of breath. This information is not intended to replace advice given to you by your health care provider. Make sure you discuss any questions you have with your health care provider. Document Released: 11/14/2005 Document Revised: 03/24/2016 Document Reviewed: 02/09/2015 Elsevier Interactive Patient Education  2017 [employer]   IF you received an x-ray today, you will receive an invoice from [facility]. Please contact [facility] at [phone] with questions or concerns regarding your invoice.   IF you received labwork today, you will receive an invoice from [facility]. Please contact [facility] at [phone] with questions or concerns regarding your invoice.   Our billing staff will not be able to assist you with questions regarding bills from these companies.  You will be contacted with the lab results as soon as they are available. The fastest way to get your results is to activate your My Chart account. Instructions are located on the last page of this paperwork. If you have not heard from us regarding the results in 2 weeks, please contact this office.     You have an appointment Wednesday 11/02/2016 at 8:45 am at [facility]  [address], [place], [place] [address] [phone]

## 2018-04-05 ENCOUNTER — Ambulatory Visit: Payer: 59 | Admitting: Family Medicine

## 2018-04-12 ENCOUNTER — Ambulatory Visit: Payer: 59 | Admitting: Adult Health

## 2018-06-04 ENCOUNTER — Encounter: Payer: Self-pay | Admitting: Adult Health

## 2018-06-04 ENCOUNTER — Ambulatory Visit (INDEPENDENT_AMBULATORY_CARE_PROVIDER_SITE_OTHER): Payer: BLUE CROSS/BLUE SHIELD | Admitting: Adult Health

## 2018-06-04 ENCOUNTER — Encounter: Payer: Self-pay | Admitting: Family Medicine

## 2018-06-04 VITALS — BP 116/67 | HR 61 | Ht 69.0 in | Wt 207.2 lb

## 2018-06-04 DIAGNOSIS — Z0000 Encounter for general adult medical examination without abnormal findings: Secondary | ICD-10-CM | POA: Insufficient documentation

## 2018-06-04 DIAGNOSIS — F419 Anxiety disorder, unspecified: Secondary | ICD-10-CM | POA: Insufficient documentation

## 2018-06-04 DIAGNOSIS — F909 Attention-deficit hyperactivity disorder, unspecified type: Secondary | ICD-10-CM | POA: Diagnosis not present

## 2018-06-04 DIAGNOSIS — R4789 Other speech disturbances: Secondary | ICD-10-CM | POA: Insufficient documentation

## 2018-06-04 DIAGNOSIS — F429 Obsessive-compulsive disorder, unspecified: Secondary | ICD-10-CM | POA: Insufficient documentation

## 2018-06-04 NOTE — Assessment & Plan Note (Signed)
Significantly pressured rate of speech and racing thoughts. Referral to psychiatry placed. 

## 2018-06-04 NOTE — Progress Notes (Signed)
Subjective:    Patient ID: Peter Escobar, male    DOB: Dec 20, 1980, 37 y.o.   MRN: [id number]  HPI:  Peter Escobar is here to establish as a new pt.  He is pleasant 37 year old male.  PMH: Excessive ETOH use- consumes "bottle a whiskey most nights if the week", and Polysubstance abuse in past, however currently denies illicit drug use. He denies ETOH causing problems with his employment or interpersonal relationships.  He reports DUI in early 2000s He reports that his mother "told me I was treated for OCD and ADD in the third grade, but then we moved and I went to a different doctor". He has not been on medications since that time nor can he recall what he was on. He denies CP/dyspnea/palpitations/dizziness/tremors. He denies depression or thoughts if harming himself/others. He reports "a few different people in my life told me I need a primary doctor, that's why I am here". He reports racing thoughts and "thinking about 5 things at once".  He states "I talked to my boss in his office today and he told me that I re-arranged his desk while we talked, I had no idea that I did that". He is a single father of 5 children ages 21, 50, 70, 87, 1 He can work 10-15 hrs/day at an [employer] center, then come home and "do the dishes, take out the trash, and build a trampoline". He reports going for weeks with little sleep, est 3-4 hrs/night then will "crash hard for about a week". He denies hx of bi-polar He denies auditory/visual hallucinations He smokes 1/2 pack to full pack/day, declined smoking cessation' He drinks 1/5 liquor most nights/week He denies regular exercise  Patient Care Team    Relationship Specialty Notifications Start End  Julaine Fusi, NP PCP - General Family Medicine  06/04/18     Patient Active Problem List   Diagnosis Date Noted  . Abnormal rate of speech 06/04/2018  . Anxiety 06/04/2018  . Attention deficit hyperactivity disorder (ADHD) 06/04/2018  .  Obsessive-compulsive disorder 06/04/2018  . Healthcare maintenance 06/04/2018   Past Medical History:  Diagnosis Date  . Anxiety      Past Surgical History:  Procedure Laterality Date  . FRACTURE SURGERY       Family History  Problem Relation Age of Onset  . Cancer Other   . Diabetes Maternal Grandmother      Social History   Substance and Sexual Activity  Drug Use Not Currently     Social History   Substance and Sexual Activity  Alcohol Use Yes   Comment: pt states that he cannot determine how much he drinks per week     Social History   Tobacco Use  Smoking Status Current Every Day Smoker  . Packs/day: 0.50  . Years: 14.00  . Pack years: 7.00  Smokeless Tobacco Never Used     Outpatient Encounter Medications as of 06/04/2018  Medication Sig  . [DISCONTINUED] ibuprofen (ADVIL,MOTRIN) 600 MG tablet Take 600 mg by mouth every 6 (six) hours as needed.  . [DISCONTINUED] naproxen (NAPROSYN) 500 MG tablet Take 1 tablet (500 mg total) by mouth 2 (two) times daily. (Patient not taking: Reported on 10/31/2016)   No facility-administered encounter medications on file as of 06/04/2018.     Allergies: Patient has no known allergies.  Body mass index is 30.6 kg/m.  Blood pressure 116/67, pulse 61, height 5\' 9"  (1.753 m), weight 207 lb 3.2 oz (94 kg), SpO2  98 %.  Review of Systems  Constitutional: Negative for activity change, appetite change, chills, diaphoresis, fatigue, fever and unexpected weight change.  HENT: Negative for congestion.   Eyes: Negative for visual disturbance.  Respiratory: Negative for cough, chest tightness, shortness of breath, wheezing and stridor.   Cardiovascular: Negative for chest pain, palpitations and leg swelling.  Gastrointestinal: Negative for abdominal distention, abdominal pain, blood in stool, constipation, diarrhea, nausea, rectal pain and vomiting.  Endocrine: Negative for cold intolerance, heat intolerance, polydipsia,  polyphagia and polyuria.  Genitourinary: Negative for difficulty urinating and frequency.  Musculoskeletal: Negative for arthralgias, back pain, gait problem, joint swelling, myalgias, neck pain and neck stiffness.  Skin: Negative for color change, pallor, rash and wound.  Neurological: Positive for headaches. Negative for dizziness.  Hematological: Does not bruise/bleed easily.  Psychiatric/Behavioral: Positive for decreased concentration. Negative for agitation, behavioral problems, confusion, dysphoric mood, hallucinations, self-injury, sleep disturbance and suicidal ideas. The patient is nervous/anxious and is hyperactive.       Objective:   Physical Exam  Constitutional: He appears well-developed and well-nourished. No distress.  HENT:  Head: Normocephalic and atraumatic.  Right Ear: External ear normal.  Left Ear: External ear normal.  Nose: Nose normal.  Mouth/Throat: Oropharynx is clear and moist.  Cardiovascular: Normal rate, regular rhythm, normal heart sounds and intact distal pulses.  No murmur heard. Pulmonary/Chest: Effort normal and breath sounds normal. No stridor. No respiratory distress. He has no wheezes. He has no rales. He exhibits no tenderness.  Neurological: He is alert.  Skin: Skin is warm and dry. Capillary refill takes less than 2 seconds. No rash noted. He is not diaphoretic. No erythema. No pallor.  Psychiatric: Thought content normal. His mood appears anxious. His speech is rapid and/or pressured. He is hyperactive. Cognition and memory are normal.      Assessment & Plan:   1. Obsessive-compulsive disorder, unspecified type   2. Attention deficit hyperactivity disorder (ADHD), unspecified ADHD type   3. Anxiety   4. Abnormal rate of speech   5. Healthcare maintenance     Healthcare maintenance  Increase water intake, strive for at least 100 ounces/day.   Follow Heart Healthy diet Increase regular exercise.  Recommend at least 30 minutes daily, 5  days per week of walking, jogging, biking, swimming, YouTube/Pinterest workout videos. Reduce-stop tobacco use. Dramatically reduce alcohol intake. Referral to Psychiatry placed, re: OCD, ADHD Recommend complete physical with fasting labs in next 3-6 months.  Obsessive-compulsive disorder Significantly pressured rate of speech and racing thoughts. Referral to psychiatry placed.  Attention deficit hyperactivity disorder (ADHD) Significantly pressured rate of speech and racing thoughts. Referral to psychiatry placed.  Abnormal rate of speech Pressured/rapid speech Racing thoughts    FOLLOW-UP:  Return in about 3 months (around 09/04/2018) for CPE, Fasting Labs.

## 2018-06-04 NOTE — Assessment & Plan Note (Signed)
Pressured/rapid speech Racing thoughts

## 2018-06-04 NOTE — Assessment & Plan Note (Signed)
  Increase water intake, strive for at least 100 ounces/day.   Follow Heart Healthy diet Increase regular exercise.  Recommend at least 30 minutes daily, 5 days per week of walking, jogging, biking, swimming, YouTube/Pinterest workout videos. Reduce-stop tobacco use. Dramatically reduce alcohol intake. Referral to Psychiatry placed, re: OCD, ADHD Recommend complete physical with fasting labs in next 3-6 months.

## 2018-06-04 NOTE — Assessment & Plan Note (Signed)
Significantly pressured rate of speech and racing thoughts. Referral to psychiatry placed.

## 2018-06-04 NOTE — Patient Instructions (Addendum)
Mediterranean Diet A Mediterranean diet refers to food and lifestyle choices that are based on the traditions of countries located on the [place]. This way of eating has been shown to help prevent certain conditions and improve outcomes for people who have chronic diseases, like kidney disease and heart disease. What are tips for following this plan? Lifestyle  Cook and eat meals together with your family, when possible.  Drink enough fluid to keep your urine clear or pale yellow.  Be physically active every day. This includes: ? Aerobic exercise like running or swimming. ? Leisure activities like gardening, walking, or housework.  Get 7-8 hours of sleep each night.  If recommended by your health care provider, drink red wine in moderation. This means 1 glass a day for nonpregnant women and 2 glasses a day for men. A glass of wine equals 5 oz (150 mL). Reading food labels  Check the serving size of packaged foods. For foods such as rice and pasta, the serving size refers to the amount of cooked product, not dry.  Check the total fat in packaged foods. Avoid foods that have saturated fat or trans fats.  Check the ingredients list for added sugars, such as corn syrup. Shopping  At the grocery store, buy most of your food from the areas near the walls of the store. This includes: ? Fresh fruits and vegetables (produce). ? Grains, beans, nuts, and seeds. Some of these may be available in unpackaged forms or large amounts (in bulk). ? Fresh seafood. ? Poultry and eggs. ? Low-fat dairy products.  Buy whole ingredients instead of prepackaged foods.  Buy fresh fruits and vegetables in-season from local farmers markets.  Buy frozen fruits and vegetables in resealable bags.  If you do not have access to quality fresh seafood, buy precooked frozen shrimp or canned fish, such as tuna, salmon, or sardines.  Buy small amounts of raw or cooked vegetables, salads, or olives from the  deli or salad bar at your store.  Stock your pantry so you always have certain foods on hand, such as olive oil, canned tuna, canned tomatoes, rice, pasta, and beans. Cooking  Cook foods with extra-virgin olive oil instead of using butter or other vegetable oils.  Have meat as a side dish, and have vegetables or grains as your main dish. This means having meat in small portions or adding small amounts of meat to foods like pasta or stew.  Use beans or vegetables instead of meat in common dishes like chili or lasagna.  Experiment with different cooking methods. Try roasting or broiling vegetables instead of steaming or sauteing them.  Add frozen vegetables to soups, stews, pasta, or rice.  Add nuts or seeds for added healthy fat at each meal. You can add these to yogurt, salads, or vegetable dishes.  Marinate fish or vegetables using olive oil, lemon juice, garlic, and fresh herbs. Meal planning  Plan to eat 1 vegetarian meal one day each week. Try to work up to 2 vegetarian meals, if possible.  Eat seafood 2 or more times a week.  Have healthy snacks readily available, such as: ? Vegetable sticks with hummus. ? Greek yogurt. ? Fruit and nut trail mix.  Eat balanced meals throughout the week. This includes: ? Fruit: 2-3 servings a day ? Vegetables: 4-5 servings a day ? Low-fat dairy: 2 servings a day ? Fish, poultry, or lean meat: 1 serving a day ? Beans and legumes: 2 or more servings a week ? Nuts   and seeds: 1-2 servings a day ? Whole grains: 6-8 servings a day ? Extra-virgin olive oil: 3-4 servings a day  Limit red meat and sweets to only a few servings a month What are my food choices?  Mediterranean diet ? Recommended ? Grains: Whole-grain pasta. Brown rice. Bulgar wheat. Polenta. Couscous. Whole-wheat bread. Orpah Cobb. ? Vegetables: Artichokes. Beets. Broccoli. Cabbage. Carrots. Eggplant. Green beans. Chard. Kale. Spinach. Onions. Leeks. Peas. Squash.  Tomatoes. Peppers. Radishes. ? Fruits: Apples. Apricots. Avocado. Berries. Bananas. Cherries. Dates. Figs. Grapes. Lemons. Melon. Oranges. Peaches. Plums. Pomegranate. ? Meats and other protein foods: Beans. Almonds. Sunflower seeds. Pine nuts. Peanuts. Cod. Salmon. Scallops. Shrimp. Tuna. Tilapia. Clams. Oysters. Eggs. ? Dairy: Low-fat milk. Cheese. Greek yogurt. ? Beverages: Water. Red wine. Herbal tea. ? Fats and oils: Extra virgin olive oil. Avocado oil. Grape seed oil. ? Sweets and desserts: [place] yogurt with honey. Baked apples. Poached pears. Trail mix. ? Seasoning and other foods: Basil. Cilantro. Coriander. Cumin. Mint. Parsley. Sage. Rosemary. Tarragon. Garlic. Oregano. Thyme. Pepper. Balsalmic vinegar. Tahini. Hummus. Tomato sauce. Olives. Mushrooms. ? Limit these ? Grains: Prepackaged pasta or rice dishes. Prepackaged cereal with added sugar. ? Vegetables: Deep fried potatoes (french fries). ? Fruits: Fruit canned in syrup. ? Meats and other protein foods: Beef. Pork. Lamb. Poultry with skin. Hot dogs. Tomasa Blase. ? Dairy: Ice cream. Sour cream. Whole milk. ? Beverages: Juice. Sugar-sweetened soft drinks. Beer. Liquor and spirits. ? Fats and oils: Butter. Canola oil. Vegetable oil. Beef fat (tallow). Lard. ? Sweets and desserts: Cookies. Cakes. Pies. Candy. ? Seasoning and other foods: Mayonnaise. Premade sauces and marinades. ? The items listed may not be a complete list. Talk with your dietitian about what dietary choices are right for you. Summary  The Mediterranean diet includes both food and lifestyle choices.  Eat a variety of fresh fruits and vegetables, beans, nuts, seeds, and whole grains.  Limit the amount of red meat and sweets that you eat.  Talk with your health care provider about whether it is safe for you to drink red wine in moderation. This means 1 glass a day for nonpregnant women and 2 glasses a day for men. A glass of wine equals 5 oz (150 mL). This information  is not intended to replace advice given to you by your health care provider. Make sure you discuss any questions you have with your health care provider. Document Released: 07/07/2016 Document Revised: 08/09/2016 Document Reviewed: 07/07/2016 Elsevier Interactive Patient Education  2018 [employer]   Obsessive-Compulsive Disorder Obsessive-compulsive disorder (OCD) is a brain-based anxiety disorder. People with OCD have obsessions, compulsions, or both. Obsessions are unwanted and distressing thoughts, ideas, or urges that keep entering your mind and result in anxiety. You may find yourself trying to ignore them. You may try to stop or undo them with a compulsion. Compulsions are repetitive physical or mental acts that you feel you have to do. They may reduce or prevent any emotional distress, but in most instances, they are ineffective. Compulsions can be very time-consuming, often taking more than one hour each day. They can interfere with personal relationships and normal activities at home, school, or work. OCD can begin in childhood, but it usually starts in young adulthood and continues throughout life. Many people with OCD also have depression or another mental health disorder. What are the causes? The cause of this condition is not known. What increases the risk? This condition is more like to develop in:  People who have experienced trauma.  People who have a family history of OCD.  Women during and after pregnancy.  People who have infections and post-infectious autoimmune syndrome.  People who have other mental health conditions.  People who abuse substances.  What are the signs or symptoms? Symptoms of OCD include compulsions and obsessions. People with obsessions usually have a fear that something terrible will happen or that they will do something terrible. Examples of common obsessions include:  Fear of contamination with germs, waste, or poisonous substances.  Fear  of making the wrong decision.  Violent or sexual thoughts or urges towards others.  Need for symmetry or exactness.  Examples of common compulsions include:  Excessive handwashing or bathing due to fear of contamination.  Checking things over and over to make sure you finished a task, such as making sure you locked a door or unplugged a toaster.  Repeating an act or phrase over and over, sometimes a specific number of times, until it feels right.  Arranging and rearranging objects to keep them in a certain order.  Having a very hard time making a decision and sticking to it.  How is this diagnosed? OCD is diagnosed through an assessment by your health care provider. Your health care provider will ask questions about any obsessions or compulsions you have and how they affect your life. Your health care provider may also ask about your medical history, prescription medicines, and drug use. Certain medical conditions and substances can cause symptoms that are similar to OCD. Your health care provider may also refer you to a mental health specialist. How is this treated? Treatment may include:  Cognitive therapy. This is a form of talk therapy. The goal is to identify and change the irrational thoughts associated with obsessions.  Behavioral therapy. A type of behavioral therapy called exposure and response prevention is often used. In this therapy, you will be exposed to the distressing situation that triggers your compulsion and be prevented from responding to it. With repetition of this process over time, you will no longer feel the distress or need to perform the compulsion.  Self-soothing. Meditation, deep breathing, or yoga can help you manage the physiological symptoms of anxiety and can help with how you think.  Medicine. Certain types of antidepressant medicine may help reduce or control OCD symptoms. Medicine is most effective when used with cognitive or behavioral  therapy.  Treatment usually involves a combination of therapy and medicines. For severe OCD that does not respond to talk therapy and medicine, brain surgery or electrical stimulation of specific areas of the brain (deep brain stimulation) may be considered. Follow these instructions at home:  Take over-the-counter and prescription medicines only as told by your health care provider. Do not start taking any new medicines with approval from your health care provider.  Consider joining a support group for people with OCD.  Keep all follow-up visits as told by your health care provider. This is important. Contact a health care provider if:  You are not able to take your medicines as prescribed.  Your symptoms get worse. Get help right away if:  You have suicidal thoughts or thoughts about hurting yourself or others. If you ever feel like you may hurt yourself or others, or have thoughts about taking your own life, get help right away. You can go to your nearest emergency department or call:  Your local emergency services (911 in the [place]  A suicide crisis helpline, such as the National Suicide Prevention Lifeline at 914-596-6842. This  is open 24-hours a day.  Summary  Obsessive-compulsive disorder (OCD) is a brain-based anxiety disorder. People with OCD have obsessions, compulsions, or both.  OCD can interfere with personal relationships and normal activities at home, school, or work.  Treatment usually involves a combination of therapy and medicines. This information is not intended to replace advice given to you by your health care provider. Make sure you discuss any questions you have with your health care provider. Document Released: 11/08/2001 Document Revised: 08/29/2016 Document Reviewed: 08/29/2016 Elsevier Interactive Patient Education  2018 [employer]   Living With Attention Deficit Hyperactivity Disorder If you have been diagnosed with attention deficit  hyperactivity disorder (ADHD), you may be relieved that you now know why you have felt or behaved a certain way. Still, you may feel overwhelmed about the treatment ahead. You may also wonder how to get the support you need and how to deal with the condition day-to-day. With treatment and support, you can live with ADHD and manage your symptoms. How to manage lifestyle changes Managing stress Stress is your body's reaction to life changes and events, both good and bad. To cope with the stress of an ADHD diagnosis, it may help to:  Learn more about ADHD.  Exercise regularly. Even a short daily walk can lower stress levels.  Participate in training or education programs (including social skills training classes) that teach you to deal with symptoms.  Medicines Your health care provider may suggest certain medicines if he or she feels that they will help to improve your condition. Stimulant medicines are usually prescribed to treat ADHD, and therapy may also be prescribed. It is important to:  Avoid using alcohol and other substances that may prevent your medicines from working properly [facility](mayinteract).  Talk with your pharmacist or health care provider about all the medicines that you take, their possible side effects, and what medicines are safe to take together.  Make it your goal to take part in all treatment decisions (shared decision-making). Ask about possible side effects of medicines that your health care provider recommends, and tell him or her how you feel about having those side effects. It is best if shared decision-making with your health care provider is part of your total treatment plan.  Relationships To strengthen your relationships with family members while treating your condition, consider taking part in family therapy. You might also attend self-help groups alone or with a loved one. Be honest about how your symptoms affect your relationships. Make an effort to communicate  respectfully instead of fighting, and find ways to show others that you care. Psychotherapy may be useful in helping you cope with how ADHD affects your relationships. How to recognize changes in your condition The following signs may mean that your treatment is working well and your condition is improving:  Consistently being on time for appointments.  Being more organized at home and work.  Other people noticing improvements in your behavior.  Achieving goals that you set for yourself.  Thinking more clearly.  The following signs may mean that your treatment is not working very well:  Feeling impatience or more confusion.  Missing, forgetting, or being late for appointments.  An increasing sense of disorganization and messiness.  More difficulty in reaching goals that you set for yourself.  Loved ones becoming angry or frustrated with you.  Where to find support Talking to others  Keep emotion out of important discussions and speak in a calm, logical way.  Listen closely and patiently  to your loved ones. Try to understand their point of view, and try to avoid getting defensive.  Take responsibility for the consequences of your actions.  Ask that others do not take your behaviors personally.  Aim to solve problems as they come up, and express your feelings instead of bottling them up.  Talk openly about what you need from your loved ones and how they can support you.  Consider going to family therapy sessions or having your family meet with a specialist who deals with ADHD-related behavior problems. Finances Not all insurance plans cover mental health care, so it is important to check with your insurance carrier. If paying for co-pays or counseling services is a problem, search for a local or county mental health care center. Public mental health care services may be offered there at a low cost or no cost when you are not able to see a private health care provider. If you  are taking medicine for ADHD, you may be able to get the generic form, which may be less expensive than brand-name medicine. Some makers of prescription medicines also offer help to patients who cannot afford the medicines that they need. Follow these instructions at home:  Take over-the-counter and prescription medicines only as told by your health care provider. Check with your health care provider before taking any new medicines.  Create structure and an organized atmosphere at home. For example: ? Make a list of tasks, then rank them from most important to least important. Work on one task at a time until your listed tasks are done. ? Make a daily schedule and follow it consistently every day. ? Use an appointment calendar, and check it 2 or 3 times a day to keep on track. Keep it with you when you leave the house. ? Create spaces where you keep certain things, and always put things back in their places after you use them.  Keep all follow-up visits as told by your health care provider. This is important. Questions to ask your health care provider:  What are the risks and benefits of taking medicines?  Would I benefit from therapy?  How often should I follow up with a health care provider? Contact a health care provider if:  You have side effects from your medicines, such as: ? Repeated muscle twitches, coughing, or speech outbursts. ? Sleep problems. ? Loss of appetite. ? Depression. ? New or worsening behavior problems. ? Dizziness. ? Unusually fast heartbeat. ? Stomach pains. ? Headaches. Get help right away if:  You have a severe reaction to a medicine.  Your behavior suddenly gets worse. Summary  With treatment and support, you can live with ADHD and manage your symptoms.  The medicines that are most often prescribed for ADHD are stimulants.  Consider taking part in family therapy or self-help groups with family members or friends.  When you talk with friends and  family about your ADHD, be patient and communicate openly.  Take over-the-counter and prescription medicines only as told by your health care provider. Check with your health care provider before taking any new medicines. This information is not intended to replace advice given to you by your health care provider. Make sure you discuss any questions you have with your health care provider. Document Released: 03/16/2017 Document Revised: 03/16/2017 Document Reviewed: 03/16/2017 Elsevier Interactive Patient Education  2018 [employer].  Increase water intake, strive for at least 100 ounces/day.   Follow Heart Healthy diet Increase regular exercise.  Recommend  at least 30 minutes daily, 5 days per week of walking, jogging, biking, swimming, YouTube/Pinterest workout videos. Reduce-stop tobacco use. Dramatically reduce alcohol intake. Referral to Psychiatry placed, re: OCD, ADHD Recommend complete physical with fasting labs in next 3-6 months. WELCOME TO THE PRACTICE!

## 2018-06-05 ENCOUNTER — Encounter: Payer: Self-pay | Admitting: Adult Health

## 2018-07-04 ENCOUNTER — Ambulatory Visit (INDEPENDENT_AMBULATORY_CARE_PROVIDER_SITE_OTHER): Payer: BLUE CROSS/BLUE SHIELD | Admitting: Psychology

## 2018-07-04 DIAGNOSIS — F418 Other specified anxiety disorders: Secondary | ICD-10-CM | POA: Diagnosis not present

## 2018-07-25 ENCOUNTER — Ambulatory Visit: Payer: Self-pay | Admitting: Psychology

## 2018-12-19 ENCOUNTER — Encounter: Payer: BLUE CROSS/BLUE SHIELD | Admitting: Adult Health

## 2019-02-08 ENCOUNTER — Other Ambulatory Visit: Payer: Self-pay

## 2019-02-08 ENCOUNTER — Other Ambulatory Visit: Payer: BLUE CROSS/BLUE SHIELD

## 2019-02-08 ENCOUNTER — Encounter: Payer: Self-pay | Admitting: Adult Health

## 2019-02-08 DIAGNOSIS — Z0000 Encounter for general adult medical examination without abnormal findings: Secondary | ICD-10-CM

## 2019-02-08 NOTE — Addendum Note (Signed)
Addended by: Stan Head on: 02/08/2019 08:46 AM   Modules accepted: Orders

## 2019-02-09 LAB — COMPREHENSIVE METABOLIC PANEL
ALBUMIN: 4.3 g/dL (ref 4.0–5.0)
ALK PHOS: 66 IU/L (ref 39–117)
ALT: 115 IU/L — ABNORMAL HIGH (ref 0–44)
AST: 60 IU/L — AB (ref 0–40)
Albumin/Globulin Ratio: 2 (ref 1.2–2.2)
BILIRUBIN TOTAL: 0.5 mg/dL (ref 0.0–1.2)
BUN / CREAT RATIO: 17 (ref 9–20)
BUN: 18 mg/dL (ref 6–20)
CHLORIDE: 105 mmol/L (ref 96–106)
CO2: 19 mmol/L — AB (ref 20–29)
CREATININE: 1.05 mg/dL (ref 0.76–1.27)
Calcium: 8.9 mg/dL (ref 8.7–10.2)
GFR calc Af Amer: 106 mL/min/{1.73_m2} (ref >59)
GFR calc non Af Amer: 92 mL/min/{1.73_m2} (ref >59)
Globulin, Total: 2.2 g/dL (ref 1.5–4.5)
Glucose: 90 mg/dL (ref 65–99)
Potassium: 4.8 mmol/L (ref 3.5–5.2)
SODIUM: 139 mmol/L (ref 134–144)
Total Protein: 6.5 g/dL (ref 6.0–8.5)

## 2019-02-09 LAB — CBC WITH DIFFERENTIAL/PLATELET
BASOS ABS: 0.1 10*3/uL (ref 0.0–0.2)
BASOS: 2 %
EOS (ABSOLUTE): 0.5 10*3/uL — AB (ref 0.0–0.4)
Eos: 9 %
Hematocrit: 42.7 % (ref 37.5–51.0)
Hemoglobin: 14.6 g/dL (ref 13.0–17.7)
IMMATURE GRANS (ABS): 0 10*3/uL (ref 0.0–0.1)
IMMATURE GRANULOCYTES: 0 %
LYMPHS: 35 %
Lymphocytes Absolute: 2.1 10*3/uL (ref 0.7–3.1)
MCH: 31.5 pg (ref 26.6–33.0)
MCHC: 34.2 g/dL (ref 31.5–35.7)
MCV: 92 fL (ref 79–97)
Monocytes Absolute: 0.9 10*3/uL (ref 0.1–0.9)
Monocytes: 14 %
NEUTROS PCT: 40 %
Neutrophils Absolute: 2.4 10*3/uL (ref 1.4–7.0)
Platelets: 228 10*3/uL (ref 150–450)
RBC: 4.64 x10E6/uL (ref 4.14–5.80)
RDW: 12.4 % (ref 11.6–15.4)
WBC: 6 10*3/uL (ref 3.4–10.8)

## 2019-02-09 LAB — LIPID PANEL
CHOL/HDL RATIO: 3.5 ratio (ref 0.0–5.0)
Cholesterol, Total: 195 mg/dL (ref 100–199)
HDL: 55 mg/dL (ref >39)
LDL CALC: 115 mg/dL — AB (ref 0–99)
Triglycerides: 125 mg/dL (ref 0–149)
VLDL Cholesterol Cal: 25 mg/dL (ref 5–40)

## 2019-02-09 LAB — HEMOGLOBIN A1C
Est. average glucose Bld gHb Est-mCnc: 103 mg/dL
HEMOGLOBIN A1C: 5.2 % (ref 4.8–5.6)

## 2019-02-09 LAB — TSH: TSH: 1.66 u[IU]/mL (ref 0.450–4.500)

## 2019-02-10 NOTE — Progress Notes (Addendum)
[facility]  Subjective:    Patient ID: Peter Escobar, male    DOB: [birth year], 38 y.o.   MRN: [id number]  HPI:06/04/2018 OV:   [name] is here to establish as a new pt.  He is pleasant 38 year old male.  PMH: Excessive ETOH use- consumes "bottle a whiskey most nights if the week", and Polysubstance abuse in past, however currently denies illicit drug use. He denies ETOH causing problems with his employment or interpersonal relationships.  He reports DUI in early 2000s He reports that his mother "told me I was treated for OCD and ADD in the third grade, but then we moved and I went to a different doctor". He has not been on medications since that time nor can he recall what he was on. He denies CP/dyspnea/palpitations/dizziness/tremors. He denies depression or thoughts if harming himself/others. He reports "a few different people in my life told me I need a primary doctor, that's why I am here". He reports racing thoughts and "thinking about 5 things at once".  He states "I talked to my boss in his office today and he told me that I re-arranged his desk while we talked, I had no idea that I did that". He is a single father of 5 children ages 43, 38, 69, 81, 1 He can work 10-15 hrs/day at an [employer] center, then come home and "do the dishes, take out the trash, and build a trampoline". He reports going for weeks with little sleep, est 3-4 hrs/night then will "crash hard for about a week". He denies hx of bi-polar He denies auditory/visual hallucinations He smokes 1/2 pack to full pack/day, declined smoking cessation' He drinks 1/5 liquor most nights/week He denies regular exercise   02/14/2019 OV: Peter Escobar is here for CPE He reports smoking 10 cigarettes/day, he declined tobacco cessation today He continues to consume ETOH heavily,up to 1/5 of Liquor/night He denies ETOH interfering with personal relationships or negatively impacting work. He denies drinking in the morning or being asked  to cut back by family/friends He denies any regular exercise and has gained 30 lbs since last OV July 2019 Current wt 237 He reports "chicken and steak getting stuck in throat", occurring now 4-5 years- he as never been evaluated by GI He also reports intermittent painful "bumps" at base of hairline on posterior cervical neck- on/off the last 5 months- none at present  Reviewed Recent Lab Results: TSH-WNL, 1.660 A1c-WNL, 5.2 CBC-stable CMP-LFT elevations, likely r/t ETOH use Long discussion on long term effects of heavy ETOH use and negative effects on CV, Neuro, GI systems  Healthcare Maintenance: Immunizations-UTD   Patient Care Team    Relationship Specialty Notifications Start End  Julaine Fusi, NP PCP - General Family Medicine  06/04/18     Patient Active Problem List   Diagnosis Date Noted  . Elevated LFTs 02/14/2019  . Dysphagia 02/14/2019  . Abnormal rate of speech 06/04/2018  . Anxiety 06/04/2018  . Attention deficit hyperactivity disorder (ADHD) 06/04/2018  . Obsessive-compulsive disorder 06/04/2018  . Healthcare maintenance 06/04/2018   Past Medical History:  Diagnosis Date  . Anxiety      Past Surgical History:  Procedure Laterality Date  . FRACTURE SURGERY       Family History  Problem Relation Age of Onset  . Cancer Other   . Diabetes Maternal Grandmother      Social History   Substance and Sexual Activity  Drug Use Not Currently     Social  History   Substance and Sexual Activity  Alcohol Use Yes   Comment: pt states that he cannot determine how much he drinks per week     Social History   Tobacco Use  Smoking Status Current Every Day Smoker  . Packs/day: 0.50  . Years: 14.00  . Pack years: 7.00  Smokeless Tobacco Never Used     No outpatient encounter medications on file as of 02/14/2019.   No facility-administered encounter medications on file as of 02/14/2019.     Allergies: Patient has no known allergies.  Body mass  index is 35.57 kg/m.  Blood pressure 112/68, pulse 85, temperature 98.6 F (37 C), temperature source Oral, height 5' 8.5" (1.74 m), weight 237 lb 6.4 oz (107.7 kg), SpO2 95 %.  Review of Systems  Constitutional: Negative for activity change, appetite change, chills, diaphoresis, fatigue, fever and unexpected weight change.  HENT: Negative for congestion.   Eyes: Negative for visual disturbance.  Respiratory: Negative for cough, chest tightness, shortness of breath, wheezing and stridor.   Cardiovascular: Negative for chest pain, palpitations and leg swelling.  Gastrointestinal: Negative for abdominal distention, abdominal pain, blood in stool, constipation, diarrhea, nausea, rectal pain and vomiting.  Endocrine: Negative for cold intolerance, heat intolerance, polydipsia, polyphagia and polyuria.  Genitourinary: Negative for difficulty urinating and frequency.  Musculoskeletal: Negative for arthralgias, back pain, gait problem, joint swelling, myalgias, neck pain and neck stiffness.  Skin: Negative for color change, pallor, rash and wound.  Neurological: Positive for headaches. Negative for dizziness.  Hematological: Does not bruise/bleed easily.  Psychiatric/Behavioral: Positive for decreased concentration. Negative for agitation, behavioral problems, confusion, dysphoric mood, hallucinations, self-injury, sleep disturbance and suicidal ideas. The patient is nervous/anxious and is hyperactive.       Objective:   Physical Exam Vitals signs and nursing note reviewed.  Constitutional:      General: He is not in acute distress.    Appearance: He is well-developed. He is not diaphoretic.  HENT:     Head: Normocephalic and atraumatic.     Right Ear: Tympanic membrane, ear canal and external ear normal. There is no impacted cerumen.     Left Ear: Tympanic membrane, ear canal and external ear normal. There is no impacted cerumen.     Nose: Nose normal. No congestion or rhinorrhea.      Mouth/Throat:     Mouth: Mucous membranes are dry.     Pharynx: Oropharynx is clear.  Eyes:     Extraocular Movements: Extraocular movements intact.     Conjunctiva/sclera: Conjunctivae normal.     Pupils: Pupils are equal, round, and reactive to light.  Neck:     Musculoskeletal: Normal range of motion. No muscular tenderness.  Cardiovascular:     Rate and Rhythm: Normal rate and regular rhythm.     Heart sounds: Normal heart sounds. No murmur.  Pulmonary:     Effort: Pulmonary effort is normal. No respiratory distress.     Breath sounds: Normal breath sounds. No stridor. No wheezing or rales.  Chest:     Chest wall: No tenderness.  Abdominal:     General: Bowel sounds are normal. There is no distension.     Palpations: There is no mass.     Tenderness: There is no abdominal tenderness. There is no right CVA tenderness, left CVA tenderness, guarding or rebound.     Hernia: No hernia is present.  Musculoskeletal: Normal range of motion.        General: No tenderness.  Lymphadenopathy:     Cervical: No cervical adenopathy.  Skin:    General: Skin is warm and dry.     Capillary Refill: Capillary refill takes less than 2 seconds.     Coloration: Skin is not pale.     Findings: No erythema or rash.  Neurological:     Mental Status: He is alert and oriented to person, place, and time.  Psychiatric:        Mood and Affect: Mood is anxious.        Speech: Speech is rapid and pressured.        Behavior: Behavior is hyperactive.        Thought Content: Thought content normal.       Assessment & Plan:   1. Dysphagia, unspecified type   2. Elevated LFTs   3. Healthcare maintenance     Healthcare maintenance Reduce alcohol use- try to keep to maximum 2 drinks/day Follow Mediterranean Diet and resume regular exercise.  Recommend at least 30 minutes daily, 5 days per week of walking, jogging, biking, swimming, YouTube/Pinterest workout videos. Referral to Gastroenterology placed:  difficulty swallowing. Reduce to stop tobacco use- you can do it. Please schedule lab appt in 6 months, re: elevated Liver Function Recommend annual physical, fasting lab appt the week prior.  Dysphagia Difficult to swallow chicken/steak the lat 4/5 years Referral to GI placed  Elevated LFTs CMP 02/09/2019- AST 60 (0-40) ALT 115 (0-44) Advised to at least reduce ETOH by 50%, ideally no more than 2 ETOH drinks/day Re-check CMP in 6 months    FOLLOW-UP:  Return in about 1 year (around 02/14/2020).

## 2019-02-14 ENCOUNTER — Ambulatory Visit (INDEPENDENT_AMBULATORY_CARE_PROVIDER_SITE_OTHER): Payer: BLUE CROSS/BLUE SHIELD | Admitting: Adult Health

## 2019-02-14 ENCOUNTER — Other Ambulatory Visit: Payer: Self-pay

## 2019-02-14 ENCOUNTER — Encounter: Payer: Self-pay | Admitting: Adult Health

## 2019-02-14 VITALS — BP 112/68 | HR 85 | Temp 98.6°F | Ht 68.5 in | Wt 237.4 lb

## 2019-02-14 DIAGNOSIS — R1310 Dysphagia, unspecified: Secondary | ICD-10-CM

## 2019-02-14 DIAGNOSIS — Z0000 Encounter for general adult medical examination without abnormal findings: Secondary | ICD-10-CM

## 2019-02-14 DIAGNOSIS — R7989 Other specified abnormal findings of blood chemistry: Secondary | ICD-10-CM

## 2019-02-14 DIAGNOSIS — R945 Abnormal results of liver function studies: Secondary | ICD-10-CM

## 2019-02-14 NOTE — Patient Instructions (Addendum)
Preventive Care for Adults, Male A healthy lifestyle and preventive care can promote health and wellness. Preventive health guidelines for men include the following key practices:  A routine yearly physical is a good way to check with your health care provider about your health and preventative screening. It is a chance to share any concerns and updates on your health and to receive a thorough exam.  Visit your dentist for a routine exam and preventative care every 6 months. Brush your teeth twice a day and floss once a day. Good oral hygiene prevents tooth decay and gum disease.  The frequency of eye exams is based on your age, health, family medical history, use of contact lenses, and other factors. Follow your health care provider's recommendations for frequency of eye exams.  Eat a healthy diet. Foods such as vegetables, fruits, whole grains, low-fat dairy products, and lean protein foods contain the nutrients you need without too many calories. Decrease your intake of foods high in solid fats, added sugars, and salt. Eat the right amount of calories for you.Get information about a proper diet from your health care provider, if necessary.  Regular physical exercise is one of the most important things you can do for your health. Most adults should get at least 150 minutes of moderate-intensity exercise (any activity that increases your heart rate and causes you to sweat) each week. In addition, most adults need muscle-strengthening exercises on 2 or more days a week.  Maintain a healthy weight. The body mass index (BMI) is a screening tool to identify possible weight problems. It provides an estimate of body fat based on height and weight. Your health care provider can find your BMI and can help you achieve or maintain a healthy weight.For adults 20 years and older:  A BMI below 18.5 is considered underweight.  A BMI of 18.5 to 24.9 is normal.  A BMI of 25 to 29.9 is considered  overweight.  A BMI of 30 and above is considered obese.  Maintain normal blood lipids and cholesterol levels by exercising and minimizing your intake of saturated fat. Eat a balanced diet with plenty of fruit and vegetables. Blood tests for lipids and cholesterol should begin at age 55 and be repeated every 5 years. If your lipid or cholesterol levels are high, you are over 50, or you are at high risk for heart disease, you may need your cholesterol levels checked more frequently.Ongoing high lipid and cholesterol levels should be treated with medicines if diet and exercise are not working.  If you smoke, find out from your health care provider how to quit. If you do not use tobacco, do not start.  Lung cancer screening is recommended for adults aged 90-80 years who are at high risk for developing lung cancer because of a history of smoking. A yearly low-dose CT scan of the lungs is recommended for people who have at least a 30-pack-year history of smoking and are a current smoker or have quit within the past 15 years. A pack year of smoking is smoking an average of 1 pack of cigarettes a day for 1 year (for example: 1 pack a day for 30 years or 2 packs a day for 15 years). Yearly screening should continue until the smoker has stopped smoking for at least 15 years. Yearly screening should be stopped for people who develop a health problem that would prevent them from having lung cancer treatment.  If you choose to drink alcohol, do not have more  than 2 drinks per day. One drink is considered to be 12 ounces (355 mL) of beer, 5 ounces (148 mL) of wine, or 1.5 ounces (44 mL) of liquor.  Avoid use of street drugs. Do not share needles with anyone. Ask for help if you need support or instructions about stopping the use of drugs.  High blood pressure causes heart disease and increases the risk of stroke. Your blood pressure should be checked at least every 1-2 years. Ongoing high blood pressure should be  treated with medicines, if weight loss and exercise are not effective.  If you are 34-90 years old, ask your health care provider if you should take aspirin to prevent heart disease.  Diabetes screening is done by taking a blood sample to check your blood glucose level after you have not eaten for a certain period of time (fasting). If you are not overweight and you do not have risk factors for diabetes, you should be screened once every 3 years starting at age 35. If you are overweight or obese and you are 70-84 years of age, you should be screened for diabetes every year as part of your cardiovascular risk assessment.  Colorectal cancer can be detected and often prevented. Most routine colorectal cancer screening begins at the age of 18 and continues through age 69. However, your health care provider may recommend screening at an earlier age if you have risk factors for colon cancer. On a yearly basis, your health care provider may provide home test kits to check for hidden blood in the stool. Use of a small camera at the end of a tube to directly examine the colon (sigmoidoscopy or colonoscopy) can detect the earliest forms of colorectal cancer. Talk to your health care provider about this at age 71, when routine screening begins. Direct exam of the colon should be repeated every 5-10 years through age 18, unless early forms of precancerous polyps or small growths are found.  People who are at an increased risk for hepatitis B should be screened for this virus. You are considered at high risk for hepatitis B if:  You were born in a country where hepatitis B occurs often. Talk with your health care provider about which countries are considered high risk.  Your parents were born in a high-risk country and you have not received a shot to protect against hepatitis B (hepatitis B vaccine).  You have HIV or AIDS.  You use needles to inject street drugs.  You live with, or have sex with, someone who  has hepatitis B.  You are a man who has sex with other men (MSM).  You get hemodialysis treatment.  You take certain medicines for conditions such as cancer, organ transplantation, and autoimmune conditions.  Hepatitis C blood testing is recommended for all people born from [birth year] through 1965 and any individual with known risks for hepatitis C.  Practice safe sex. Use condoms and avoid high-risk sexual practices to reduce the spread of sexually transmitted infections (STIs). STIs include gonorrhea, chlamydia, syphilis, trichomonas, herpes, HPV, and human immunodeficiency virus (HIV). Herpes, HIV, and HPV are viral illnesses that have no cure. They can result in disability, cancer, and death.  If you are a man who has sex with other men, you should be screened at least once per year for:  HIV.  Urethral, rectal, and pharyngeal infection of gonorrhea, chlamydia, or both.  If you are at risk of being infected with HIV, it is recommended that you take a  prescription medicine daily to prevent HIV infection. This is called preexposure prophylaxis (PrEP). You are considered at risk if:  You are a man who has sex with other men (MSM) and have other risk factors.  You are a heterosexual man, are sexually active, and are at increased risk for HIV infection.  You take drugs by injection.  You are sexually active with a partner who has HIV.  Talk with your health care provider about whether you are at high risk of being infected with HIV. If you choose to begin PrEP, you should first be tested for HIV. You should then be tested every 3 months for as long as you are taking PrEP.  A one-time screening for abdominal aortic aneurysm (AAA) and surgical repair of large AAAs by ultrasound are recommended for men ages 44 to 66 years who are current or former smokers.  Healthy men should no longer receive prostate-specific antigen (PSA) blood tests as part of routine cancer screening. Talk with your health  care provider about prostate cancer screening.  Testicular cancer screening is not recommended for adult males who have no symptoms. Screening includes self-exam, a health care provider exam, and other screening tests. Consult with your health care provider about any symptoms you have or any concerns you have about testicular cancer.  Use sunscreen. Apply sunscreen liberally and repeatedly throughout the day. You should seek shade when your shadow is shorter than you. Protect yourself by wearing long sleeves, pants, a wide-brimmed hat, and sunglasses year round, whenever you are outdoors.  Once a month, do a whole-body skin exam, using a mirror to look at the skin on your back. Tell your health care provider about new moles, moles that have irregular borders, moles that are larger than a pencil eraser, or moles that have changed in shape or color.  Stay current with required vaccines (immunizations).  Influenza vaccine. All adults should be immunized every year.  Tetanus, diphtheria, and acellular pertussis (Td, Tdap) vaccine. An adult who has not previously received Tdap or who does not know his vaccine status should receive 1 dose of Tdap. This initial dose should be followed by tetanus and diphtheria toxoids (Td) booster doses every 10 years. Adults with an unknown or incomplete history of completing a 3-dose immunization series with Td-containing vaccines should begin or complete a primary immunization series including a Tdap dose. Adults should receive a Td booster every 10 years.  Varicella vaccine. An adult without evidence of immunity to varicella should receive 2 doses or a second dose if he has previously received 1 dose.  Human papillomavirus (HPV) vaccine. Males aged 11-21 years who have not received the vaccine previously should receive the 3-dose series. Males aged 22-26 years may be immunized. Immunization is recommended through the age of 23 years for any male who has sex with males  and did not get any or all doses earlier. Immunization is recommended for any person with an immunocompromised condition through the age of 72 years if he did not get any or all doses earlier. During the 3-dose series, the second dose should be obtained 4-8 weeks after the first dose. The third dose should be obtained 24 weeks after the first dose and 16 weeks after the second dose.  Zoster vaccine. One dose is recommended for adults aged 23 years or older unless certain conditions are present.  Measles, mumps, and rubella (MMR) vaccine. Adults born before [birth year] generally are considered immune to measles and mumps. Adults born in [birth year]  or later should have 1 or more doses of MMR vaccine unless there is a contraindication to the vaccine or there is laboratory evidence of immunity to each of the three diseases. A routine second dose of MMR vaccine should be obtained at least 28 days after the first dose for students attending postsecondary schools, health care workers, or international travelers. People who received inactivated measles vaccine or an unknown type of measles vaccine during 1963-1967 should receive 2 doses of MMR vaccine. People who received inactivated mumps vaccine or an unknown type of mumps vaccine before 1979 and are at high risk for mumps infection should consider immunization with 2 doses of MMR vaccine. Unvaccinated health care workers born before [birth year] who lack laboratory evidence of measles, mumps, or rubella immunity or laboratory confirmation of disease should consider measles and mumps immunization with 2 doses of MMR vaccine or rubella immunization with 1 dose of MMR vaccine.  Pneumococcal 13-valent conjugate (PCV13) vaccine. When indicated, a person who is uncertain of his immunization history and has no record of immunization should receive the PCV13 vaccine. All adults 9 years of age and older should receive this vaccine. An adult aged 69 years or older who has certain medical  conditions and has not been previously immunized should receive 1 dose of PCV13 vaccine. This PCV13 should be followed with a dose of pneumococcal polysaccharide (PPSV23) vaccine. Adults who are at high risk for pneumococcal disease should obtain the PPSV23 vaccine at least 8 weeks after the dose of PCV13 vaccine. Adults older than 36 years of age who have normal immune system function should obtain the PPSV23 vaccine dose at least 1 year after the dose of PCV13 vaccine.  Pneumococcal polysaccharide (PPSV23) vaccine. When PCV13 is also indicated, PCV13 should be obtained first. All adults aged 79 years and older should be immunized. An adult younger than age 43 years who has certain medical conditions should be immunized. Any person who resides in a nursing home or long-term care facility should be immunized. An adult smoker should be immunized. People with an immunocompromised condition and certain other conditions should receive both PCV13 and PPSV23 vaccines. People with human immunodeficiency virus (HIV) infection should be immunized as soon as possible after diagnosis. Immunization during chemotherapy or radiation therapy should be avoided. Routine use of PPSV23 vaccine is not recommended for American Indians, [place] Natives, or people younger than 65 years unless there are medical conditions that require PPSV23 vaccine. When indicated, people who have unknown immunization and have no record of immunization should receive PPSV23 vaccine. One-time revaccination 5 years after the first dose of PPSV23 is recommended for people aged 19-64 years who have chronic kidney failure, nephrotic syndrome, asplenia, or immunocompromised conditions. People who received 1-2 doses of PPSV23 before age 70 years should receive another dose of PPSV23 vaccine at age 79 years or later if at least 5 years have passed since the previous dose. Doses of PPSV23 are not needed for people immunized with PPSV23 at or after age 55  years.  Meningococcal vaccine. Adults with asplenia or persistent complement component deficiencies should receive 2 doses of quadrivalent meningococcal conjugate (MenACWY-D) vaccine. The doses should be obtained at least 2 months apart. Microbiologists working with certain meningococcal bacteria, [employer] recruits, people at risk during an outbreak, and people who travel to or live in countries with a high rate of meningitis should be immunized. A first-year college student up through age 64 years who is living in a residence hall should receive a  dose if he did not receive a dose on or after his 16th birthday. Adults who have certain high-risk conditions should receive one or more doses of vaccine.  Hepatitis A vaccine. Adults who wish to be protected from this disease, have chronic liver disease, work with hepatitis A-infected animals, work in hepatitis A research labs, or travel to or work in countries with a high rate of hepatitis A should be immunized. Adults who were previously unvaccinated and who anticipate close contact with an international adoptee during the first 60 days after arrival in the [place] from a country with a high rate of hepatitis A should be immunized.  Hepatitis B vaccine. Adults should be immunized if they wish to be protected from this disease, are under age 34 years and have diabetes, have chronic liver disease, have had more than one sex partner in the past 6 months, may be exposed to blood or other infectious body fluids, are household contacts or sex partners of hepatitis B positive people, are clients or workers in certain care facilities, or travel to or work in countries with a high rate of hepatitis B.  Haemophilus influenzae type b (Hib) vaccine. A previously unvaccinated person with asplenia or sickle cell disease or having a scheduled splenectomy should receive 1 dose of Hib vaccine. Regardless of previous immunization, a recipient of a hematopoietic stem cell  transplant should receive a 3-dose series 6-12 months after his successful transplant. Hib vaccine is not recommended for adults with HIV infection. Preventive Service / Frequency Ages 77 to 55  Blood pressure check.** / Every 3-5 years.  Lipid and cholesterol check.** / Every 5 years beginning at age 66.  Hepatitis C blood test.** / For any individual with known risks for hepatitis C.  Skin self-exam. / Monthly.  Influenza vaccine. / Every year.  Tetanus, diphtheria, and acellular pertussis (Tdap, Td) vaccine.** / Consult your health care provider. 1 dose of Td every 10 years.  Varicella vaccine.** / Consult your health care provider.  HPV vaccine. / 3 doses over 6 months, if 45 or younger.  Measles, mumps, rubella (MMR) vaccine.** / You need at least 1 dose of MMR if you were born in 1957 or later. You may also need a second dose.  Pneumococcal 13-valent conjugate (PCV13) vaccine.** / Consult your health care provider.  Pneumococcal polysaccharide (PPSV23) vaccine.** / 1 to 2 doses if you smoke cigarettes or if you have certain conditions.  Meningococcal vaccine.** / 1 dose if you are age 81 to 79 years and a [employer] researcher living in a residence hall, or have one of several medical conditions. You may also need additional booster doses.  Hepatitis A vaccine.** / Consult your health care provider.  Hepatitis B vaccine.** / Consult your health care provider.  Haemophilus influenzae type b (Hib) vaccine.** / Consult your health care provider. Ages 6 to 58  Blood pressure check.** / Every year.  Lipid and cholesterol check.** / Every 5 years beginning at age 89.  Lung cancer screening. / Every year if you are aged 84-80 years and have a 30-pack-year history of smoking and currently smoke or have quit within the past 15 years. Yearly screening is stopped once you have quit smoking for at least 15 years or develop a health problem that would prevent you from having  lung cancer treatment.  Fecal occult blood test (FOBT) of stool. / Every year beginning at age 90 and continuing until age 73. You may not have to do  this test if you get a colonoscopy every 10 years.  Flexible sigmoidoscopy** or colonoscopy.** / Every 5 years for a flexible sigmoidoscopy or every 10 years for a colonoscopy beginning at age 50 and continuing until age 75.  Hepatitis C blood test.** / For all people born from 1945 through 1965 and any individual with known risks for hepatitis C.  Skin self-exam. / Monthly.  Influenza vaccine. / Every year.  Tetanus, diphtheria, and acellular pertussis (Tdap/Td) vaccine.** / Consult your health care provider. 1 dose of Td every 10 years.  Varicella vaccine.** / Consult your health care provider.  Zoster vaccine.** / 1 dose for adults aged 60 years or older.  Measles, mumps, rubella (MMR) vaccine.** / You need at least 1 dose of MMR if you were born in 1957 or later. You may also need a second dose.  Pneumococcal 13-valent conjugate (PCV13) vaccine.** / Consult your health care provider.  Pneumococcal polysaccharide (PPSV23) vaccine.** / 1 to 2 doses if you smoke cigarettes or if you have certain conditions.  Meningococcal vaccine.** / Consult your health care provider.  Hepatitis A vaccine.** / Consult your health care provider.  Hepatitis B vaccine.** / Consult your health care provider.  Haemophilus influenzae type b (Hib) vaccine.** / Consult your health care provider. Ages 65 and over  Blood pressure check.** / Every year.  Lipid and cholesterol check.**/ Every 5 years beginning at age 20.  Lung cancer screening. / Every year if you are aged 55-80 years and have a 30-pack-year history of smoking and currently smoke or have quit within the past 15 years. Yearly screening is stopped once you have quit smoking for at least 15 years or develop a health problem that would prevent you from having lung cancer treatment.  Fecal  occult blood test (FOBT) of stool. / Every year beginning at age 50 and continuing until age 75. You may not have to do this test if you get a colonoscopy every 10 years.  Flexible sigmoidoscopy** or colonoscopy.** / Every 5 years for a flexible sigmoidoscopy or every 10 years for a colonoscopy beginning at age 50 and continuing until age 75.  Hepatitis C blood test.** / For all people born from 1945 through 1965 and any individual with known risks for hepatitis C.  Abdominal aortic aneurysm (AAA) screening.** / A one-time screening for ages 65 to 75 years who are current or former smokers.  Skin self-exam. / Monthly.  Influenza vaccine. / Every year.  Tetanus, diphtheria, and acellular pertussis (Tdap/Td) vaccine.** / 1 dose of Td every 10 years.  Varicella vaccine.** / Consult your health care provider.  Zoster vaccine.** / 1 dose for adults aged 60 years or older.  Pneumococcal 13-valent conjugate (PCV13) vaccine.** / 1 dose for all adults aged 65 years and older.  Pneumococcal polysaccharide (PPSV23) vaccine.** / 1 dose for all adults aged 65 years and older.  Meningococcal vaccine.** / Consult your health care provider.  Hepatitis A vaccine.** / Consult your health care provider.  Hepatitis B vaccine.** / Consult your health care provider.  Haemophilus influenzae type b (Hib) vaccine.** / Consult your health care provider. **Family history and personal history of risk and conditions may change your health care provider's recommendations.   This information is not intended to replace advice given to you by your health care provider. Make sure you discuss any questions you have with your health care provider.   Document Released: 01/10/2002 Document Revised: 12/05/2014 Document Reviewed: 04/11/2011 Elsevier Interactive Patient Education 2016   [employer]  Coronavirus (COVID-19) Are you at risk?  Are you at risk for the Coronavirus (COVID-19)?  To be considered HIGH RISK  for Coronavirus (COVID-19), you have to meet the following criteria:  . Traveled to [place], [place], [place], [place] or [place]; or in the [place] to [place], [place], [place], or [place]; and have fever, cough, and shortness of breath within the last 2 weeks of travel OR . Been in close contact with a person diagnosed with COVID-19 within the last 2 weeks and have fever, cough, and shortness of breath . IF YOU DO NOT MEET THESE CRITERIA, YOU ARE CONSIDERED LOW RISK FOR COVID-19.  What to do if you are HIGH RISK for COVID-19?  . If you are having a medical emergency, call 911. . Seek medical care right away. Before you go to a doctor's office, urgent care or emergency department, call ahead and tell them about your recent travel, contact with someone diagnosed with COVID-19, and your symptoms. You should receive instructions from your physician's office regarding next steps of care.  . When you arrive at healthcare provider, tell the healthcare staff immediately you have returned from visiting [place], [place], [place], [place] or [place]; or traveled in the [place] to [place], [place], [place], or [place]; in the last two weeks or you have been in close contact with a person diagnosed with COVID-19 in the last 2 weeks.   . Tell the health care staff about your symptoms: fever, cough and shortness of breath. . After you have been seen by a medical provider, you will be either: o Tested for (COVID-19) and discharged home on quarantine except to seek medical care if symptoms worsen, and asked to  - Stay home and avoid contact with others until you get your results (4-5 days)  - Avoid travel on public transportation if possible (such as bus, train, or airplane) or o Sent to the Emergency Department by EMS for evaluation, COVID-19 testing, and possible admission depending on your condition and test results.  What to do if you are LOW RISK for COVID-19?  Reduce your risk of  any infection by using the same precautions used for avoiding the common cold or flu:  . Wash your hands often with soap and warm water for at least 20 seconds.  If soap and water are not readily available, use an alcohol-based hand sanitizer with at least 60% alcohol.  . If coughing or sneezing, cover your mouth and nose by coughing or sneezing into the elbow areas of your shirt or coat, into a tissue or into your sleeve (not your hands). . Avoid shaking hands with others and consider head nods or verbal greetings only. . Avoid touching your eyes, nose, or mouth with unwashed hands.  . Avoid close contact with people who are sick. . Avoid places or events with large numbers of people in one location, like concerts or sporting events. . Carefully consider travel plans you have or are making. . If you are planning any travel outside or inside the [place], visit the CDC's Travel[employer] Health webpage for the latest health notices. . If you have some symptoms but not all symptoms, continue to monitor at home and seek medical attention if your symptoms worsen. . If you are having a medical emergency, call 911.   ADDITIONAL HEALTHCARE OPTIONS FOR PATIENTS  [facility] / e-Visit: [email or web address]         [facility]:   [facility]: [phone]                   [facility]: [phone]       Mediterranean Diet  Why follow it? Research shows. . Those who follow the Mediterranean diet have a reduced risk of heart disease  . The diet is associated with a reduced incidence of Parkinson's and Alzheimer's diseases . People following the diet may have longer life expectancies and lower rates of chronic diseases  . The Dietary Guidelines for Americans recommends the Mediterranean diet as an eating plan to promote health and prevent disease  What Is the Mediterranean Diet?  . Healthy eating plan  based on typical foods and recipes of Mediterranean-style cooking . The diet is primarily a plant based diet; these foods should make up a majority of meals   Starches - Plant based foods should make up a majority of meals - They are an important sources of vitamins, minerals, energy, antioxidants, and fiber - Choose whole grains, foods high in fiber and minimally processed items  - Typical grain sources include wheat, oats, barley, corn, brown rice, bulgar, farro, millet, polenta, couscous  - Various types of beans include chickpeas, lentils, fava beans, black beans, white beans   Fruits  Veggies - Large quantities of antioxidant rich fruits & veggies; 6 or more servings  - Vegetables can be eaten raw or lightly drizzled with oil and cooked  - Vegetables common to the traditional Mediterranean Diet include: artichokes, arugula, beets, broccoli, brussel sprouts, cabbage, carrots, celery, collard greens, cucumbers, eggplant, kale, leeks, lemons, lettuce, mushrooms, okra, onions, peas, peppers, potatoes, pumpkin, radishes, rutabaga, shallots, spinach, sweet potatoes, turnips, zucchini - Fruits common to the Mediterranean Diet include: apples, apricots, avocados, cherries, clementines, dates, figs, grapefruits, grapes, melons, nectarines, oranges, peaches, pears, pomegranates, strawberries, tangerines  Fats - Replace butter and margarine with healthy oils, such as olive oil, canola oil, and tahini  - Limit nuts to no more than a handful a day  - Nuts include walnuts, almonds, pecans, pistachios, pine nuts  - Limit or avoid candied, honey roasted or heavily salted nuts - Olives are central to the [employer] - can be eaten whole or used in a variety of dishes   Meats Protein - Limiting red meat: no more than a few times a month - When eating red meat: choose lean cuts and keep the portion to the size of deck of cards - Eggs: approx. 0 to 4 times a week  - Fish and lean poultry: at least 2 a  week  - Healthy protein sources include, chicken, [place], lean beef, lamb - Increase intake of seafood such as tuna, salmon, trout, mackerel, shrimp, scallops - Avoid or limit high fat processed meats such as sausage and bacon  Dairy - Include moderate amounts of low fat dairy products  - Focus on healthy dairy such as fat free yogurt, skim milk, low or reduced fat cheese - Limit dairy products higher in fat such as whole or 2% milk, cheese, ice cream  Alcohol - Moderate amounts of red wine is ok  - No more than 5 oz daily for women (all ages) and men older than age 72  - No more than 10 oz of wine daily for men younger than 26  Other - Limit sweets and other desserts  - Use herbs and spices instead of salt to flavor foods  - Herbs and spices common to the  traditional Mediterranean Diet include: basil, bay leaves, chives, cloves, cumin, fennel, garlic, lavender, marjoram, mint, oregano, parsley, pepper, rosemary, sage, savory, sumac, tarragon, thyme   It's not just a diet, it's a lifestyle:  . The Mediterranean diet includes lifestyle factors typical of those in the region  . Foods, drinks and meals are best eaten with others and savored . Daily physical activity is important for overall good health . This could be strenuous exercise like running and aerobics . This could also be more leisurely activities such as walking, housework, yard-work, or taking the stairs . Moderation is the key; a balanced and healthy diet accommodates most foods and drinks . Consider portion sizes and frequency of consumption of certain foods   Meal Ideas & Options:  . Breakfast:  o Whole wheat toast or whole wheat English muffins with peanut butter & hard boiled egg o Steel cut oats topped with apples & cinnamon and skim milk  o Fresh fruit: banana, strawberries, melon, berries, peaches  o Smoothies: strawberries, bananas, greek yogurt, peanut butter o Low fat greek yogurt with blueberries and granola  o Egg  white omelet with spinach and mushrooms o Breakfast couscous: whole wheat couscous, apricots, skim milk, cranberries  . Sandwiches:  o Hummus and grilled vegetables (peppers, zucchini, squash) on whole wheat bread   o Grilled chicken on whole wheat pita with lettuce, tomatoes, cucumbers or tzatziki  o Tuna salad on whole wheat bread: tuna salad made with greek yogurt, olives, red peppers, capers, green onions o Garlic rosemary lamb pita: lamb sauted with garlic, rosemary, salt & pepper; add lettuce, cucumber, greek yogurt to pita - flavor with lemon juice and black pepper  . Seafood:  o Mediterranean grilled salmon, seasoned with garlic, basil, parsley, lemon juice and black pepper o Shrimp, lemon, and spinach whole-grain pasta salad made with low fat greek yogurt  o Seared scallops with lemon orzo  o Seared tuna steaks seasoned salt, pepper, coriander topped with tomato mixture of olives, tomatoes, olive oil, minced garlic, parsley, green onions and cappers  . Meats:  o Herbed greek chicken salad with kalamata olives, cucumber, feta  o Red bell peppers stuffed with spinach, bulgur, lean ground beef (or lentils) & topped with feta   o Kebabs: skewers of chicken, tomatoes, onions, zucchini, squash  o [place] burgers: made with red onions, mint, dill, lemon juice, feta cheese topped with roasted red peppers . Vegetarian o Cucumber salad: cucumbers, artichoke hearts, celery, red onion, feta cheese, tossed in olive oil & lemon juice  o Hummus and whole grain pita points with a greek salad (lettuce, tomato, feta, olives, cucumbers, red onion) o Lentil soup with celery, carrots made with vegetable broth, garlic, salt and pepper  o Tabouli salad: parsley, bulgur, mint, scallions, cucumbers, tomato, radishes, lemon juice, olive oil, salt and pepper.    Reduce alcohol use- try to keep to maximum 2 drinks/day Follow Mediterranean Diet and resume regular exercise.  Recommend at least 30 minutes daily,  5 days per week of walking, jogging, biking, swimming, YouTube/Pinterest workout videos. Referral to Gastroenterology placed: difficulty swallowing. Reduce to stop tobacco use- you can do it. Please schedule lab appt in 6 months, re: elevated Liver Function Recommend annual physical, fasting lab appt the week prior. NICE TO SEE YOU!

## 2019-02-14 NOTE — Assessment & Plan Note (Signed)
Reduce alcohol use- try to keep to maximum 2 drinks/day Follow Mediterranean Diet and resume regular exercise.  Recommend at least 30 minutes daily, 5 days per week of walking, jogging, biking, swimming, YouTube/Pinterest workout videos. Referral to Gastroenterology placed: difficulty swallowing. Reduce to stop tobacco use- you can do it. Please schedule lab appt in 6 months, re: elevated Liver Function Recommend annual physical, fasting lab appt the week prior.

## 2019-02-14 NOTE — Assessment & Plan Note (Signed)
CMP 02/09/2019- AST 60 (0-40) ALT 115 (0-44) Advised to at least reduce ETOH by 50%, ideally no more than 2 ETOH drinks/day Re-check CMP in 6 months

## 2019-02-14 NOTE — Assessment & Plan Note (Signed)
Difficult to swallow chicken/steak the lat 4/5 years Referral to GI placed

## 2019-02-15 ENCOUNTER — Encounter: Payer: Self-pay | Admitting: Gastroenterology

## 2019-03-27 ENCOUNTER — Ambulatory Visit: Payer: Self-pay | Admitting: Gastroenterology

## 2019-04-24 ENCOUNTER — Ambulatory Visit: Payer: BLUE CROSS/BLUE SHIELD | Admitting: Gastroenterology

## 2019-04-25 ENCOUNTER — Ambulatory Visit: Payer: Self-pay | Admitting: Gastroenterology

## 2021-04-09 ENCOUNTER — Emergency Department (HOSPITAL_COMMUNITY)
Admission: EM | Admit: 2021-04-09 | Discharge: 2021-04-09 | Disposition: A | Discharge status: Home / Self Care | Payer: Self-pay | Attending: Emergency Medicine | Admitting: Emergency Medicine

## 2021-04-09 ENCOUNTER — Other Ambulatory Visit: Payer: Self-pay

## 2021-04-09 ENCOUNTER — Emergency Department (HOSPITAL_COMMUNITY): Payer: Self-pay

## 2021-04-09 ENCOUNTER — Encounter (HOSPITAL_COMMUNITY): Payer: Self-pay | Admitting: *Deleted

## 2021-04-09 DIAGNOSIS — R040 Epistaxis: Secondary | ICD-10-CM | POA: Insufficient documentation

## 2021-04-09 DIAGNOSIS — S0511XA Contusion of eyeball and orbital tissues, right eye, initial encounter: Secondary | ICD-10-CM | POA: Insufficient documentation

## 2021-04-09 DIAGNOSIS — F17210 Nicotine dependence, cigarettes, uncomplicated: Secondary | ICD-10-CM | POA: Insufficient documentation

## 2021-04-09 DIAGNOSIS — M545 Low back pain, unspecified: Secondary | ICD-10-CM | POA: Insufficient documentation

## 2021-04-09 DIAGNOSIS — R0981 Nasal congestion: Secondary | ICD-10-CM | POA: Insufficient documentation

## 2021-04-09 DIAGNOSIS — S0292XA Unspecified fracture of facial bones, initial encounter for closed fracture: Secondary | ICD-10-CM | POA: Insufficient documentation

## 2021-04-09 DIAGNOSIS — S060X0A Concussion without loss of consciousness, initial encounter: Secondary | ICD-10-CM | POA: Insufficient documentation

## 2021-04-09 DIAGNOSIS — S81801A Unspecified open wound, right lower leg, initial encounter: Secondary | ICD-10-CM | POA: Insufficient documentation

## 2021-04-09 LAB — COMPREHENSIVE METABOLIC PANEL
ALT: 162 U/L — ABNORMAL HIGH (ref 0–44)
AST: 168 U/L — ABNORMAL HIGH (ref 15–41)
Albumin: 3.9 g/dL (ref 3.5–5.0)
Alkaline Phosphatase: 89 U/L (ref 38–126)
Anion gap: 11 (ref 5–15)
BUN: 8 mg/dL (ref 6–20)
CO2: 24 mmol/L (ref 22–32)
Calcium: 9.5 mg/dL (ref 8.9–10.3)
Chloride: 101 mmol/L (ref 98–111)
Creatinine, Ser: 0.99 mg/dL (ref 0.61–1.24)
GFR, Estimated: >60 mL/min (ref >60)
Glucose, Bld: 145 mg/dL — ABNORMAL HIGH (ref 70–99)
Potassium: 3.8 mmol/L (ref 3.5–5.1)
Sodium: 136 mmol/L (ref 135–145)
Total Bilirubin: 0.6 mg/dL (ref 0.3–1.2)
Total Protein: 7.2 g/dL (ref 6.5–8.1)

## 2021-04-09 LAB — CBC WITH DIFFERENTIAL/PLATELET
Abs Immature Granulocytes: 0.01 10*3/uL (ref 0.00–0.07)
Basophils Absolute: 0.1 10*3/uL (ref 0.0–0.1)
Basophils Relative: 1 %
Eosinophils Absolute: 0.4 10*3/uL (ref 0.0–0.5)
Eosinophils Relative: 7 %
HCT: 46.2 % (ref 39.0–52.0)
Hemoglobin: 15.6 g/dL (ref 13.0–17.0)
Immature Granulocytes: 0 %
Lymphocytes Relative: 37 %
Lymphs Abs: 2.2 10*3/uL (ref 0.7–4.0)
MCH: 32.4 pg (ref 26.0–34.0)
MCHC: 33.8 g/dL (ref 30.0–36.0)
MCV: 96 fL (ref 80.0–100.0)
Monocytes Absolute: 0.7 10*3/uL (ref 0.1–1.0)
Monocytes Relative: 12 %
Neutro Abs: 2.5 10*3/uL (ref 1.7–7.7)
Neutrophils Relative %: 43 %
Platelets: 258 10*3/uL (ref 150–400)
RBC: 4.81 MIL/uL (ref 4.22–5.81)
RDW: 12.1 % (ref 11.5–15.5)
WBC: 6 10*3/uL (ref 4.0–10.5)
nRBC: 0 % (ref 0.0–0.2)
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
## 2021-04-09 NOTE — ED Notes (Signed)
Sent pt mychart signup link so he would be able to see his labs including elevated liver enzymes.  Advised pt to stop drinking etoh and take ibuprofen sparingly and recheck labs after one to two months.
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
## 2021-09-25 IMAGING — CT CT HEAD W/O CM
4 series · 14 of 47 positions shown, 16 images · non-contrast
Comparison: None.

CLINICAL DATA: Facial trauma.

EXAM:
CT HEAD WITHOUT CONTRAST
CT MAXILLOFACIAL WITHOUT CONTRAST
TECHNIQUE: Multidetector CT imaging of the head and maxillofacial structures
were performed using the standard protocol without intravenous
contrast. Multiplanar CT image reconstructions of the maxillofacial
structures were also generated.

[Series 3: head wo · axial · 0.43mm/px · z∈[-112,+8]mm · 7 of 34 slices shown, 9 images]
[im 5/34  brain]
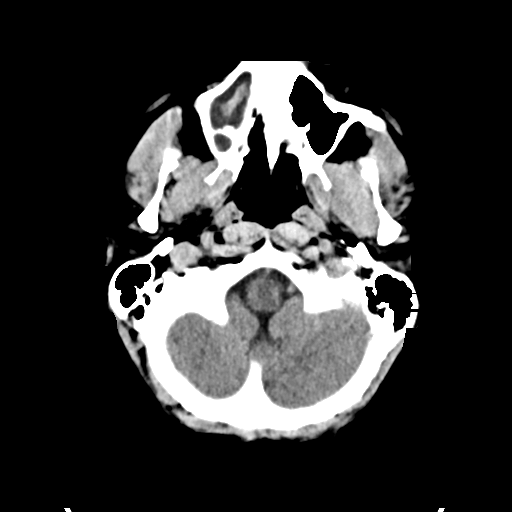
[im 5/34  bone]
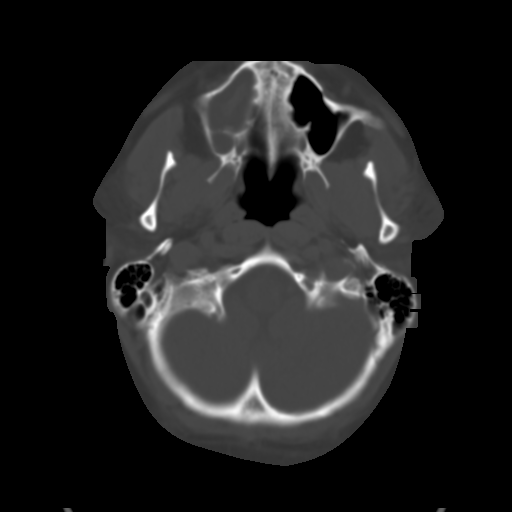
[im 9/34  brain]
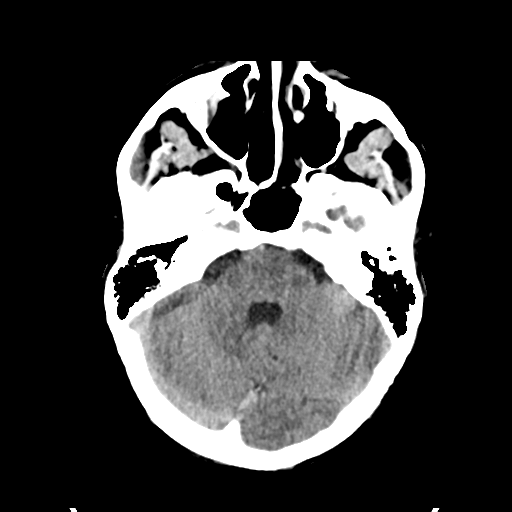
[im 13/34  brain]
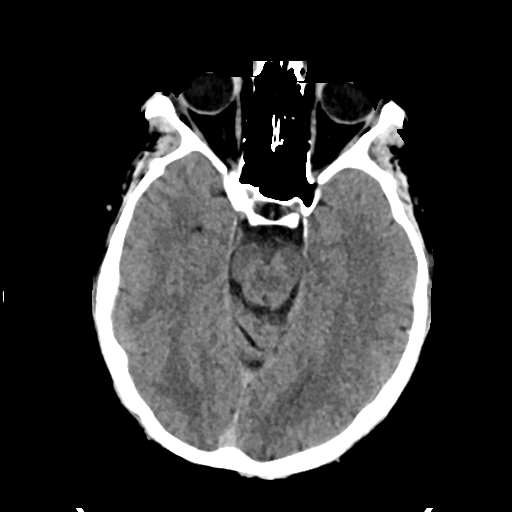
[im 17/34  brain]
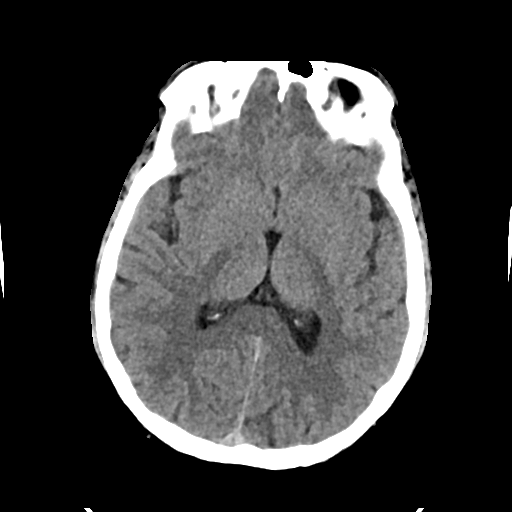
[im 21/34  brain]
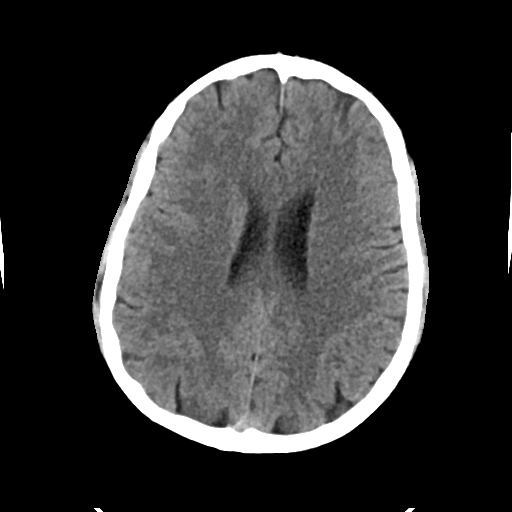
[im 21/34  bone]
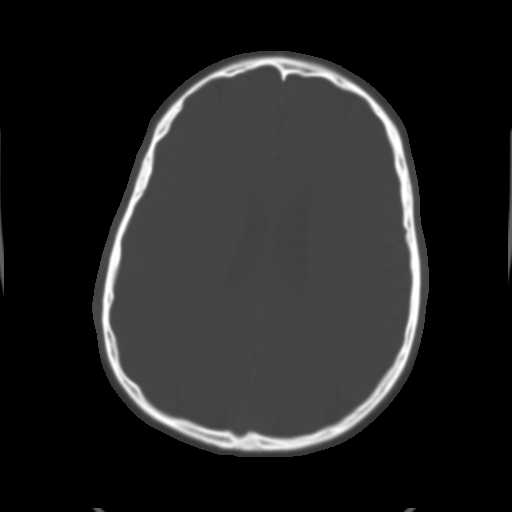
[im 25/34  brain]
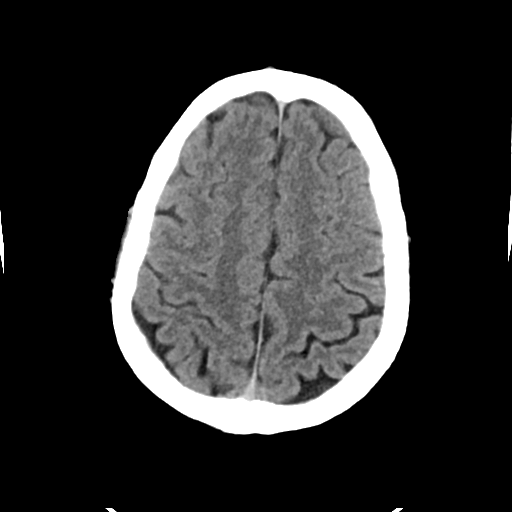
[im 29/34  brain]
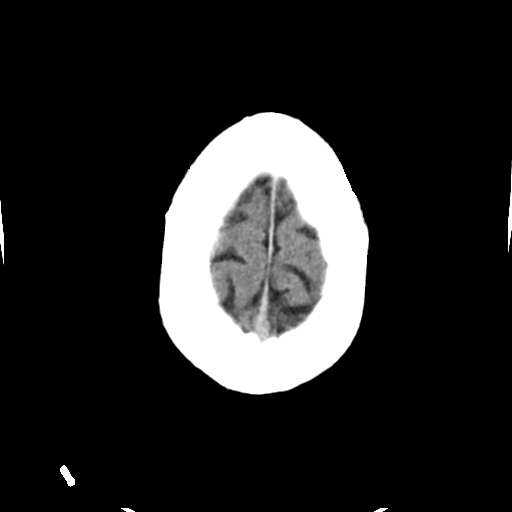

[Series 4: head bone · axial · 0.43mm/px · 1 of 84 slices shown]
[im 9/84  bone]
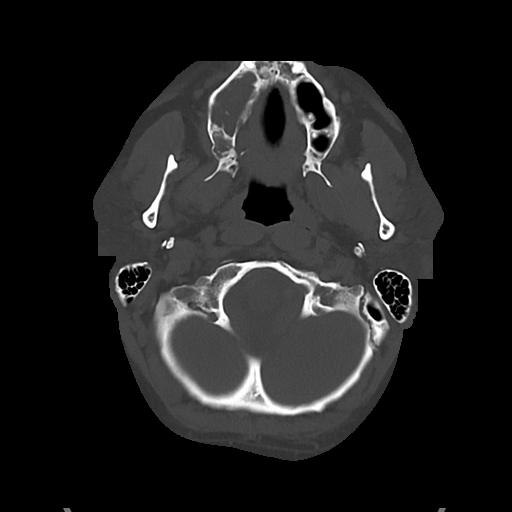

[Series 5: cor soft · coronal · 0.33mm/px · 3 of 72 slices shown]
[im 24/72  brain]
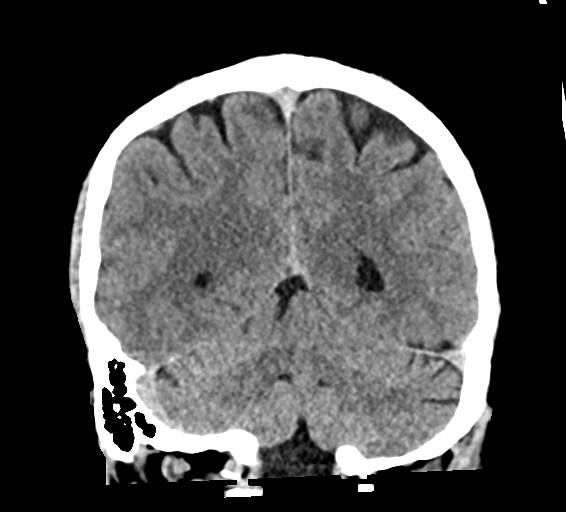
[im 32/72  brain]
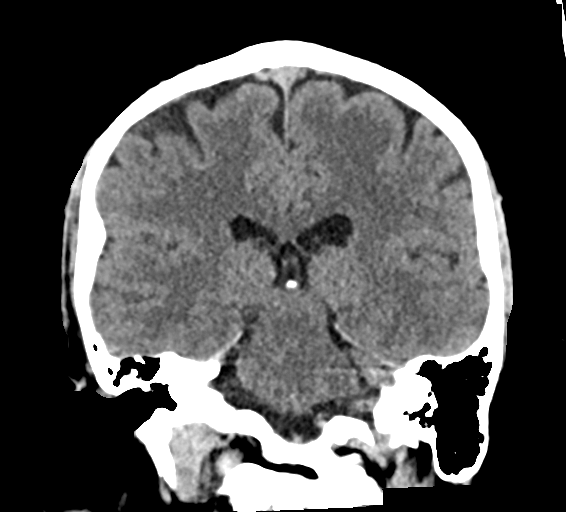
[im 40/72  brain]
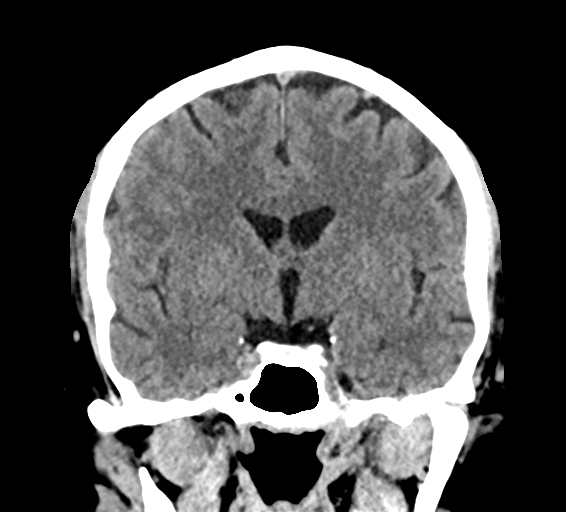

[Series 6: sag soft · sagittal · 0.33mm/px · 3 of 63 slices shown]
[im 21/63  brain]
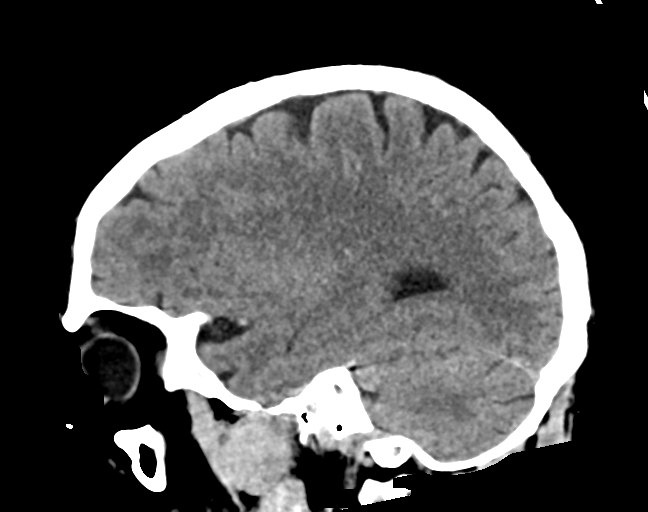
[im 32/63  brain]
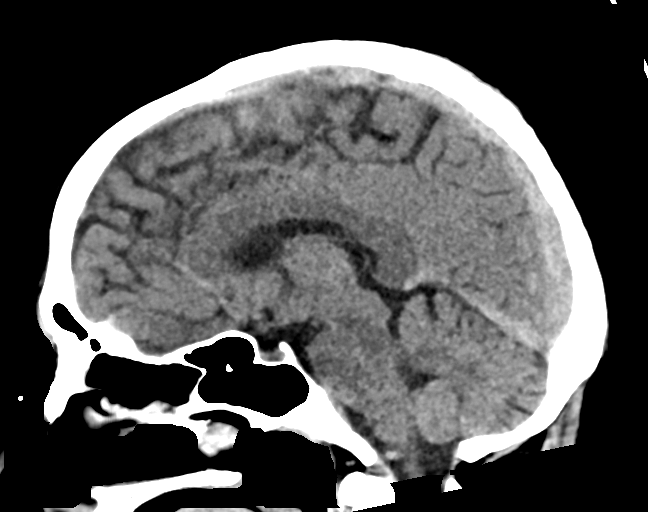
[im 42/63  brain]
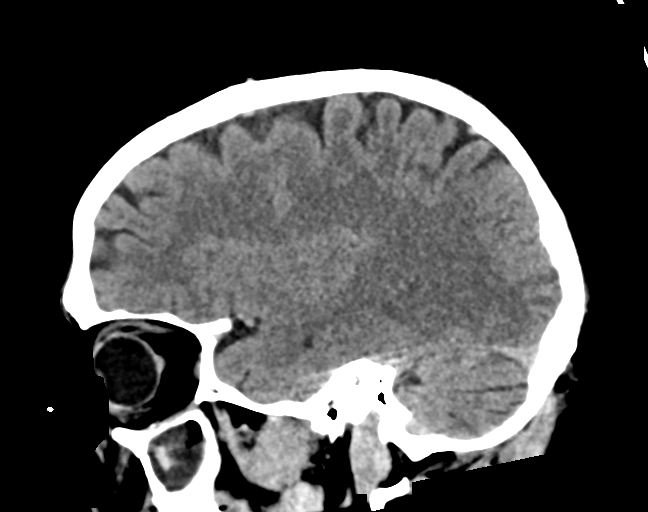

[14 of 47 positions shown; findings below may reference images not displayed]

FINDINGS: CT HEAD FINDINGS

Brain: Ventricles are within normal limits in size and
configuration. No mass, hemorrhage, edema or other evidence of acute
parenchymal abnormality. No extra-axial hemorrhage.

Vascular: No hyperdense vessel or unexpected calcification.

Skull: Normal. Negative for fracture or focal lesion.

Other: None.

CT MAXILLOFACIAL FINDINGS

Osseous: Lower frontal bones are intact. Slightly displaced nasal
bone fractures bilaterally. Displaced/comminuted fracture involving
the anterior and anterior-lateral walls of the RIGHT orbital floor,
contiguous with a comminuted fracture within the anterior wall of
the RIGHT maxillary sinus. Additional nondisplaced fracture of the
posterior wall the RIGHT maxillary sinus.

Deformity of the nasal septum is of uncertain age, but suspect some
component of the deformity is acute.

Medial and superior walls of the RIGHT orbit are intact.
Chronic-appearing deformity of the LEFT medial orbital wall. Osseous
structures about the LEFT orbit appear otherwise intact.

Old healed fracture of the LEFT zygomatic arch. Acute-appearing
nondisplaced fracture at the posterior aspect of the RIGHT zygomatic
arch. Slightly displaced fractures of the LEFT lateral pterygoid
plate and RIGHT medial pterygoid plate

Orbits: Orbital globes appear intact and symmetric in position. No
retro-orbital hemorrhage or edema.

Sinuses: Fluid and/or mucosal thickening within the RIGHT maxillary
sinus. Remainder of the paranasal sinuses are clear.

Soft tissues: Scalp edema overlying the lower frontal bones. No
circumscribed soft tissue hematoma within the superficial soft
tissues overlying the facial bones.
IMPRESSION: 1. No acute intracranial abnormality. No intracranial mass,
hemorrhage or edema. No skull fracture.
2. Displaced/comminuted fracture involving the anterior and
anterior-lateral walls of the RIGHT orbital floor, contiguous with a
comminuted fracture within the anterior wall of the RIGHT maxillary
sinus. No orbital rectus muscle entrapment or displacement. Medial
and superior walls of the RIGHT orbit are intact and normally
aligned.
3. Additional nondisplaced fracture of the posterior-inferior wall
of the RIGHT maxillary sinus.
4. Deformity of the nasal septum is of uncertain age, but suspect
some component of the deformity is acute.
5. Slightly displaced fractures of the LEFT lateral pterygoid plate
and RIGHT medial pterygoid plate.
6. Slightly displaced nasal bone fractures bilaterally.
7. Old healed fracture of the LEFT zygomatic arch.
8. Chronic-appearing deformity of the LEFT medial orbital wall.
9. Scalp edema overlying the lower frontal bones. No circumscribed
soft tissue hematoma within the superficial soft tissues overlying
the facial bones.

## 2021-09-25 IMAGING — CT CT MAXILLOFACIAL W/O CM
3 of 8 series · 14 of 47 positions shown, 17 images · non-contrast
Comparison: None.

CLINICAL DATA: Facial trauma.

EXAM:
CT HEAD WITHOUT CONTRAST
CT MAXILLOFACIAL WITHOUT CONTRAST
TECHNIQUE: Multidetector CT imaging of the head and maxillofacial structures
were performed using the standard protocol without intravenous
contrast. Multiplanar CT image reconstructions of the maxillofacial
structures were also generated.

[Series 4: st thins · axial · 0.41mm/px · z∈[-182,-38]mm · 9 of 258 slices shown, 12 images]
[im 26/258  brain]
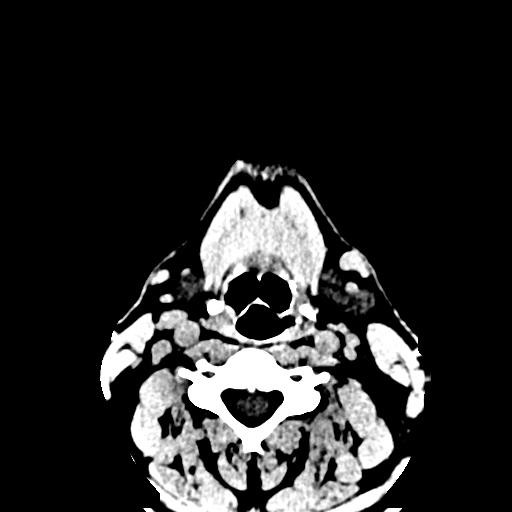
[im 26/258  bone]
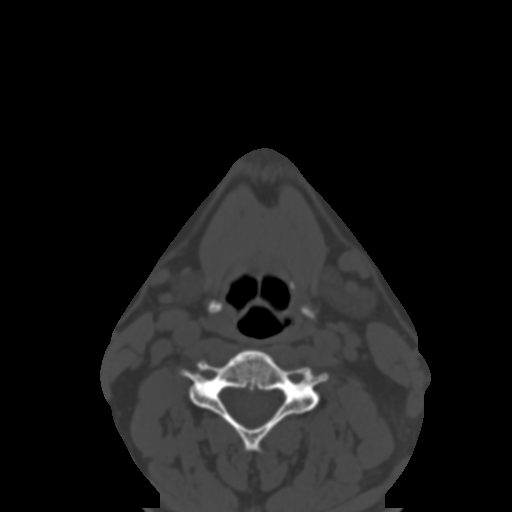
[im 52/258  bone]
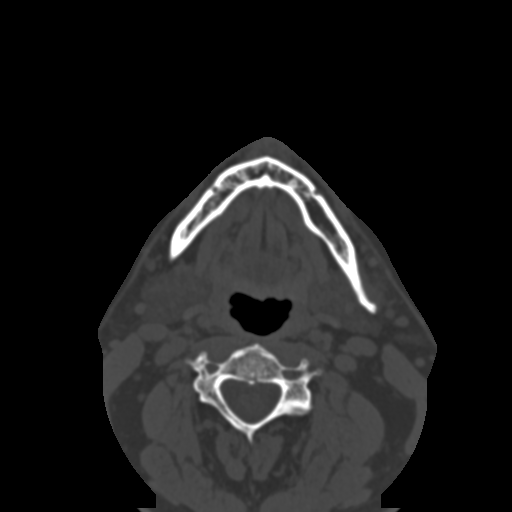
[im 78/258  bone]
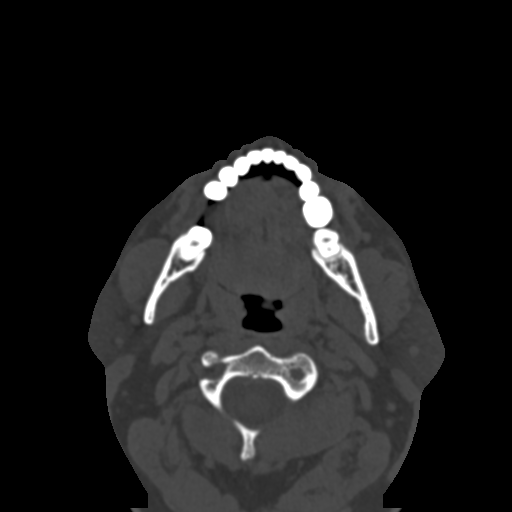
[im 103/258  bone]
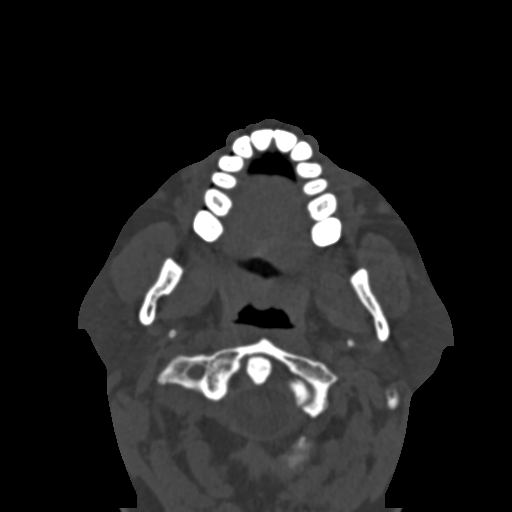
[im 129/258  brain]
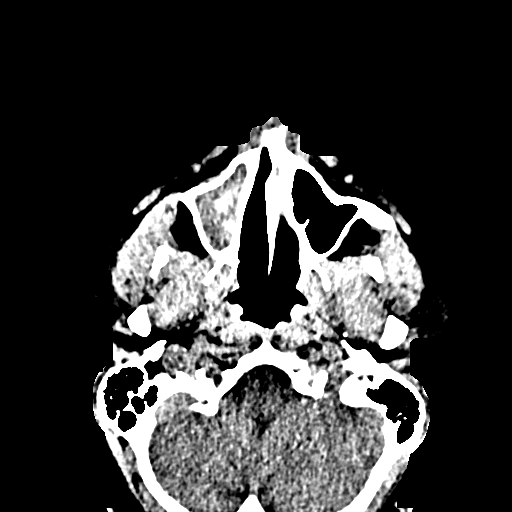
[im 129/258  bone]
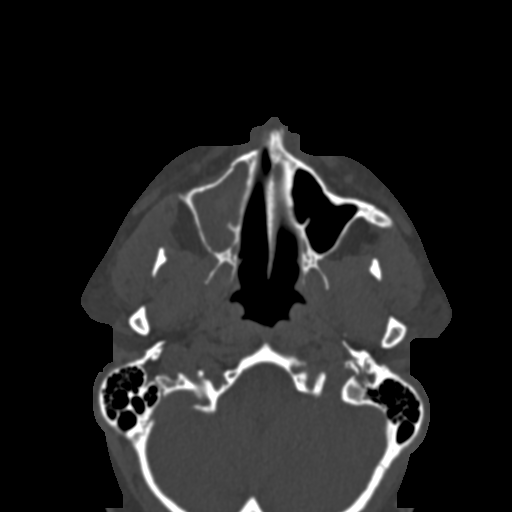
[im 155/258  bone]
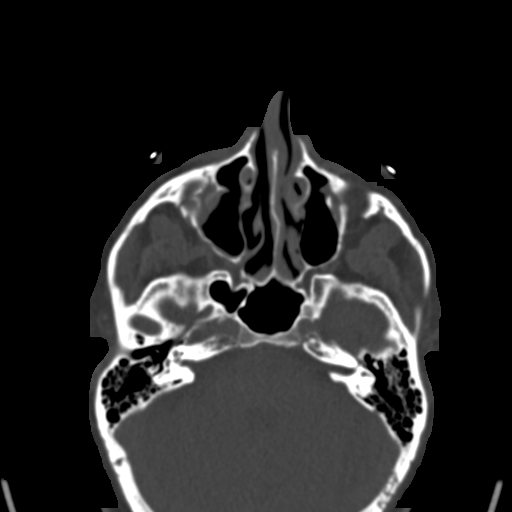
[im 180/258  bone]
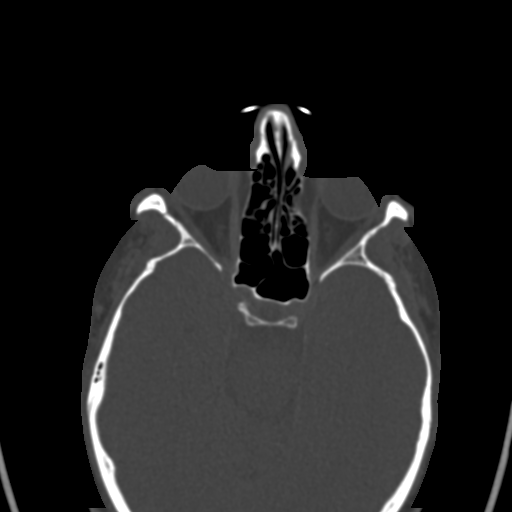
[im 206/258  bone]
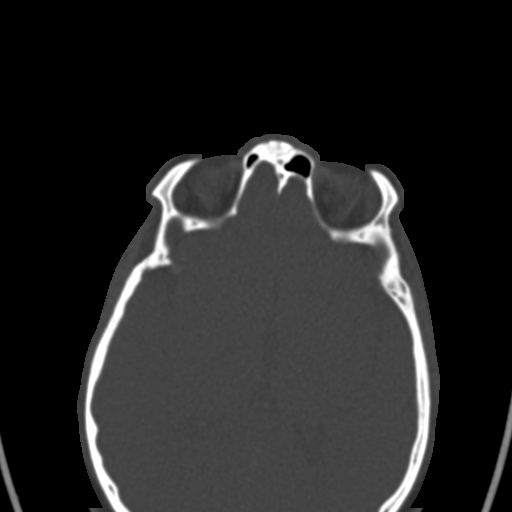
[im 232/258  brain]
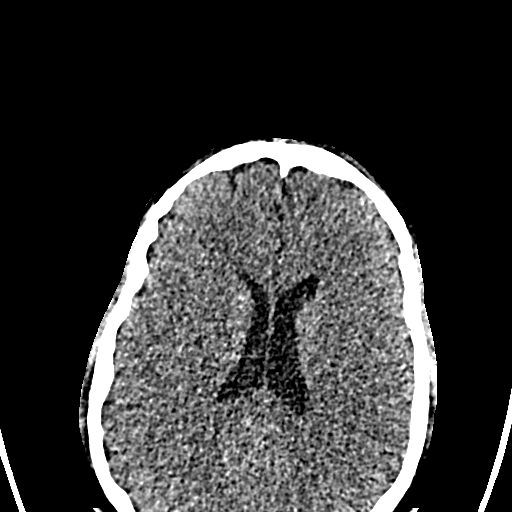
[im 232/258  bone]
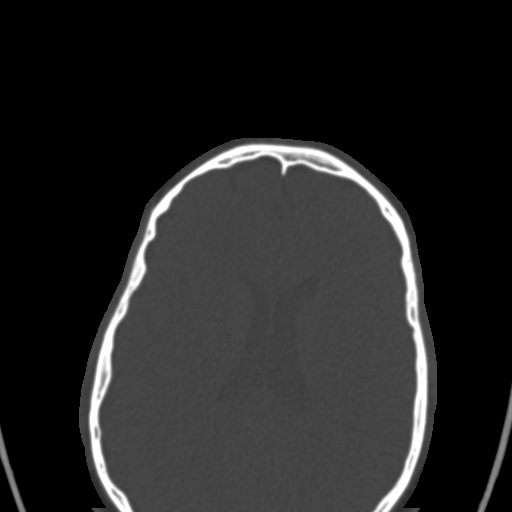

[Series 7: st cor · coronal · 0.35mm/px · 3 of 80 slices shown]
[im 20/80  bone]
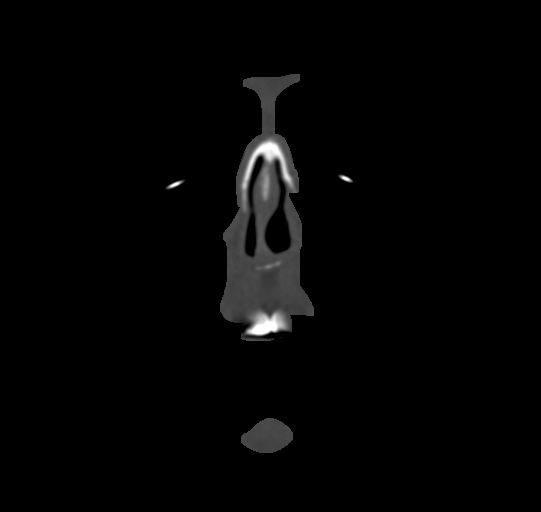
[im 40/80  bone]
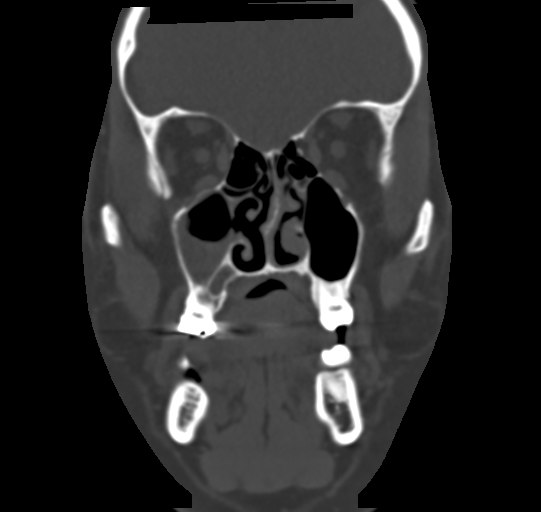
[im 60/80  bone]
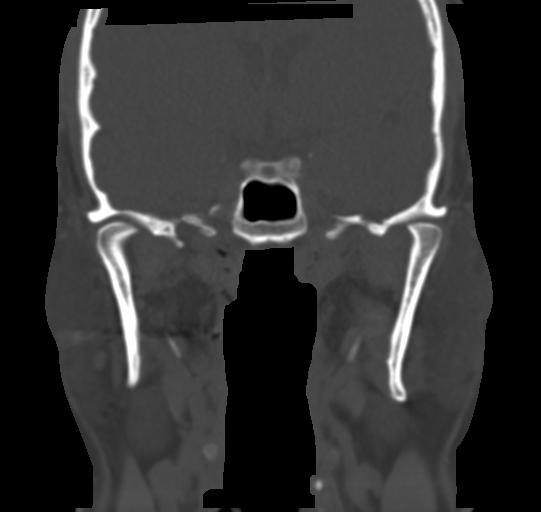

[Series 10: bone sag · sagittal · 0.33mm/px · 2 of 94 slices shown]
[im 32/94  bone]
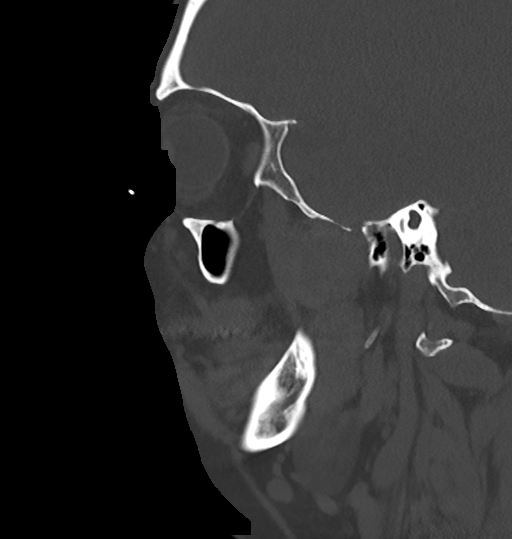
[im 63/94  bone]
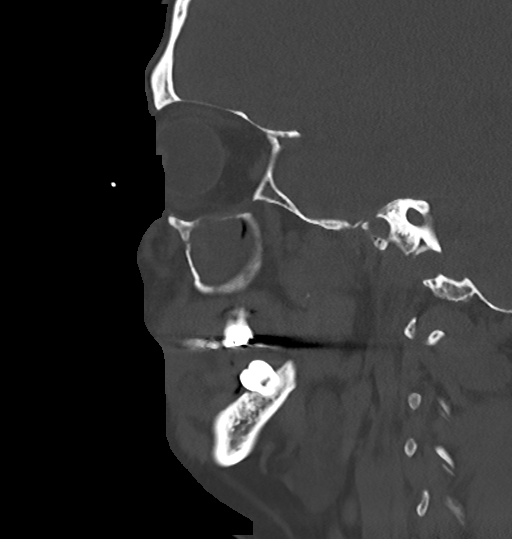

[14 of 47 positions shown; findings below may reference images not displayed]

FINDINGS: CT HEAD FINDINGS

Brain: Ventricles are within normal limits in size and
configuration. No mass, hemorrhage, edema or other evidence of acute
parenchymal abnormality. No extra-axial hemorrhage.

Vascular: No hyperdense vessel or unexpected calcification.

Skull: Normal. Negative for fracture or focal lesion.

Other: None.

CT MAXILLOFACIAL FINDINGS

Osseous: Lower frontal bones are intact. Slightly displaced nasal
bone fractures bilaterally. Displaced/comminuted fracture involving
the anterior and anterior-lateral walls of the RIGHT orbital floor,
contiguous with a comminuted fracture within the anterior wall of
the RIGHT maxillary sinus. Additional nondisplaced fracture of the
posterior wall the RIGHT maxillary sinus.

Deformity of the nasal septum is of uncertain age, but suspect some
component of the deformity is acute.

Medial and superior walls of the RIGHT orbit are intact.
Chronic-appearing deformity of the LEFT medial orbital wall. Osseous
structures about the LEFT orbit appear otherwise intact.

Old healed fracture of the LEFT zygomatic arch. Acute-appearing
nondisplaced fracture at the posterior aspect of the RIGHT zygomatic
arch. Slightly displaced fractures of the LEFT lateral pterygoid
plate and RIGHT medial pterygoid plate

Orbits: Orbital globes appear intact and symmetric in position. No
retro-orbital hemorrhage or edema.

Sinuses: Fluid and/or mucosal thickening within the RIGHT maxillary
sinus. Remainder of the paranasal sinuses are clear.

Soft tissues: Scalp edema overlying the lower frontal bones. No
circumscribed soft tissue hematoma within the superficial soft
tissues overlying the facial bones.
IMPRESSION: 1. No acute intracranial abnormality. No intracranial mass,
hemorrhage or edema. No skull fracture.
2. Displaced/comminuted fracture involving the anterior and
anterior-lateral walls of the RIGHT orbital floor, contiguous with a
comminuted fracture within the anterior wall of the RIGHT maxillary
sinus. No orbital rectus muscle entrapment or displacement. Medial
and superior walls of the RIGHT orbit are intact and normally
aligned.
3. Additional nondisplaced fracture of the posterior-inferior wall
of the RIGHT maxillary sinus.
4. Deformity of the nasal septum is of uncertain age, but suspect
some component of the deformity is acute.
5. Slightly displaced fractures of the LEFT lateral pterygoid plate
and RIGHT medial pterygoid plate.
6. Slightly displaced nasal bone fractures bilaterally.
7. Old healed fracture of the LEFT zygomatic arch.
8. Chronic-appearing deformity of the LEFT medial orbital wall.
9. Scalp edema overlying the lower frontal bones. No circumscribed
soft tissue hematoma within the superficial soft tissues overlying
the facial bones.

## 2022-04-25 ENCOUNTER — Ambulatory Visit (HOSPITAL_COMMUNITY)
Admission: EM | Admit: 2022-04-25 | Discharge: 2022-04-25 | Disposition: A | Discharge status: Home / Self Care | Payer: No Payment, Other | Attending: Psychiatry | Admitting: Psychiatry

## 2022-04-25 ENCOUNTER — Encounter (HOSPITAL_COMMUNITY): Payer: Self-pay | Admitting: Registered Nurse

## 2022-04-25 DIAGNOSIS — Z6379 Other stressful life events affecting family and household: Secondary | ICD-10-CM | POA: Insufficient documentation

## 2022-04-25 DIAGNOSIS — Z0489 Encounter for examination and observation for other specified reasons: Secondary | ICD-10-CM | POA: Insufficient documentation

## 2022-04-25 DIAGNOSIS — F909 Attention-deficit hyperactivity disorder, unspecified type: Secondary | ICD-10-CM | POA: Insufficient documentation

## 2022-04-25 DIAGNOSIS — F1090 Alcohol use, unspecified, uncomplicated: Secondary | ICD-10-CM | POA: Insufficient documentation

## 2022-04-25 DIAGNOSIS — F1291 Cannabis use, unspecified, in remission: Secondary | ICD-10-CM | POA: Insufficient documentation

## 2022-04-25 DIAGNOSIS — Z79899 Other long term (current) drug therapy: Secondary | ICD-10-CM

## 2022-04-25 DIAGNOSIS — F419 Anxiety disorder, unspecified: Secondary | ICD-10-CM | POA: Insufficient documentation

## 2022-04-25 DIAGNOSIS — R4589 Other symptoms and signs involving emotional state: Secondary | ICD-10-CM

## 2022-04-25 DIAGNOSIS — F429 Obsessive-compulsive disorder, unspecified: Secondary | ICD-10-CM | POA: Insufficient documentation

## 2022-04-25 NOTE — Progress Notes (Signed)
   04/25/22 1915  [facility] Triage Screening (Walk-ins at [facility] only)  How Did You Hear About [place]? Family/Friend  What Is the Reason for Your Visit/Call Today? Pt says his family recommended he come to [facility] due start therapy and medication. Pt says he was diagnosed with ADHD as a child and prescribed medication but his provider died and he never saw a psychiatrist. He says he has recognized, and people have mentioned to him, that he is very anxious, has little patience, jumps from one topic to the next, and has difficulty staying on task. He reports erratic sleep (4-12 hours per night) and racing thoughts. He says he used marijuana for 20 years, which helped him remain calm, but stopped using 5 years ago. He states he then started using alcohol but recognized that was becoming a problem so one month ago he reduced his alcohol use to a couple of drinks a week. He and his partner have 5 children and one who will be born in 6 months. Pt cares for the children while his partner works. He denies current suicidal ideation or history of suicide attempts. He denies homicidal ideation or history of aggression. He denies auditory or visual hallucinations and does not exhibit delusional thought content.  How Long Has This Been Causing You Problems? > than 6 months  Have You Recently Had Any Thoughts About Hurting Yourself? No  Are You Planning to Commit Suicide/Harm Yourself At This time? No  Have you Recently Had Thoughts About Hurting Someone Karolee Ohs? No  Are You Planning To Harm Someone At This Time? No  Are you currently experiencing any auditory, visual or other hallucinations? No  Have You Used Any Alcohol or Drugs in the Past 24 Hours? No  Do you have any current medical co-morbidities that require immediate attention? No  Clinician description of patient physical appearance/behavior: Pt is casually dressed and well-groomed. He is alert and oriented x4. Pt speaks in a clear tone, at moderate volume and fast pace.  Motor behavior appears normal. Eye contact is good. Pt's mood is anxious and affect is congruent with mood. Thought process is coherent and relevant. There is no indication Pt is currently responding to internal stimuli or experiencing delusional thought content. He is cooperative.  What Do You Feel Would Help You the Most Today? Treatment for Depression or other mood problem;Medication(s)  If access to [facility] was not available, would you have sought care in the Emergency Department? No  Determination of Need Routine (7 days)  Options For Referral Outpatient Therapy;Medication Management

## 2022-04-25 NOTE — Discharge Instructions (Signed)
Pt to f/u with out patient psychiatry to establish care for ADHD and medication management.

## 2022-04-25 NOTE — ED Provider Notes (Signed)
Behavioral [facility] Urgent Care Medical Screening Exam  Patient Name: Peter Escobar MRN: [id number] Date of Evaluation: 04/25/22 Chief Complaint:   Diagnosis:  Final diagnoses:  Medication management  Anxious appearance    History of Present illness: Peter Escobar is a 41 y.o. male. With a history of OCD,  Anxiety,  and ADHD.  Present to [facility],  enquiring about restarting medication for ADHD and anxiety.  Per the patient he was on ADHD medicine a couple years ago when he was living in [place], but then his psychiatrist had passed and he moved to [place] and did not continue taking medication.  Per the patient he was self-medicating, smoking weed, and drinking.  According to patient it has been a while since he has been having anxiety, patient stated he has OCD.  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  According to patient his mother and friends keep telling him that he needs to see someone, so he can get back on his medicine.   Observation of patient, patient is alert and oriented x4, speech is clear.  Patient maintained good eye contact, mood is anxious, affect congruent with mood.  Patient denies SI, HI, AVH, or paranoia.  Patient denies current marijuana smoking or illicit drug use, per the patient he does drink 1-2 times a week which is a bottle of wine.  Patient reside in the [place] area.  Discussed with patient that I will provide him outpatient resources where he can call and make an appointment, so he can establish care with a psych provider who can evaluate him and maybe start him on medication.  Recommend discharge with resources  Psychiatric Specialty Exam  Presentation  General Appearance:Appropriate for Environment  Eye Contact:Good  Speech:Clear and Coherent  Speech  Volume:Normal  Handedness:Ambidextrous   Mood and Affect  Mood:Anxious  Affect:Appropriate   Thought Process  Thought Processes:Coherent  Descriptions of Associations:Circumstantial  Orientation:Full (Time, Place and Person)  Thought Content:Abstract Reasoning    Hallucinations:None  Ideas of Reference:None  Suicidal Thoughts:No  Homicidal Thoughts:No   Sensorium  Memory:Immediate Good  Judgment:Good  Insight:Good   Executive Functions  Concentration:Good  Attention Span:Fair  Recall:Good  Fund of Knowledge:Good  Language:Good   Psychomotor Activity  Psychomotor Activity:Normal   Assets  Assets:No data recorded  Sleep  Sleep:Fair  Number of hours: No data recorded  Nutritional Assessment (For OBS and FBC admissions only) Has the patient had a weight loss or gain of 10 pounds or more in the last 3 months?: No Has the patient had a decrease in food intake/or appetite?: No Does the patient have dental problems?: No Does the patient have eating habits or behaviors that may be indicators of an eating disorder including binging or inducing vomiting?: No Has the patient recently lost weight without trying?: 0 Has the patient been eating poorly because of a decreased appetite?: 0 Malnutrition Screening Tool Score: 0    Physical Exam: Physical Exam HENT:     Head: Normocephalic.     Nose: Nose normal.  Cardiovascular:     Rate and Rhythm: Normal rate.  Pulmonary:     Effort: Pulmonary effort is normal.  Musculoskeletal:        General: Normal range of motion.     Cervical back: Normal range of motion.  Skin:  General: Skin is warm.  Neurological:     General: No focal deficit present.     Mental Status: He is alert.  Psychiatric:        Thought Content: Thought content normal.   Review of Systems  Constitutional: Negative.   HENT: Negative.    Eyes: Negative.   Respiratory: Negative.    Cardiovascular: Negative.    Gastrointestinal: Negative.   Genitourinary: Negative.   Musculoskeletal: Negative.   Skin: Negative.   Neurological: Negative.   Endo/Heme/Allergies: Negative.   Psychiatric/Behavioral:  The patient is nervous/anxious.   Blood pressure (!) 141/98, pulse 74, temperature 98.5 F (36.9 C), temperature source Oral, resp. rate 18, SpO2 96 %. There is no height or weight on file to calculate BMI.  Musculoskeletal: Strength & Muscle Tone: within normal limits Gait & Station: normal Patient leans: N/A   [facility] MSE Discharge Disposition for Follow up and Recommendations: Based on my evaluation the patient does not appear to have an emergency medical condition and can be discharged with resources and follow up care in outpatient services for Medication Management   Sindy Guadeloupe, NP 04/25/2022, 8:56 PM

## 2022-04-25 NOTE — ED Provider Notes (Incomplete)
Behavioral Health Urgent Care Medical Screening Exam  Patient Name: Peter Escobar MRN: [id number] Date of Evaluation: 04/25/22 Chief Complaint:   Diagnosis:  Final diagnoses:  Medication management  Anxious appearance    History of Present illness: Peter Escobar is a 41 y.o. male. With a history of OCD,  Anxiety,  and ADHD.  Present to [facility],  enquiring about restarting medication for ADHD and anxiety.  Per the patient he was on ADHD medicine a couple years ago when he was living in [place], but then his psychiatrist had passed and he moved to [place] and did not continue taking medication.  Per the patient he was self-medicating, smoking weed, and drinking.  According to patient it has been a while since he has been having anxiety, patient stated he has OCD.  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  According to patient his mother and friends keep telling him that he needs to see someone, so he can get back on his medicine.   Observation of patient, patient is alert and oriented x4, speech is clear.  Patient maintained good eye contact, mood is anxious, affect congruent with mood.  Patient denies SI, HI, AVH, or paranoia.  Patient denies current marijuana smoking or illicit drug use, per the patient he does drink 1-2 times a week which is a bottle of wine.  Patient reside in the [place] area.  Discussed with patient that I will provide him outpatient resources where he can call and make an appointment, so he can establish care with a psych provider who can evaluate him and maybe start him on medication.  Recommend discharge with resources  Psychiatric Specialty Exam  Presentation  General Appearance:Appropriate for Environment  Eye Contact:Good  Speech:Clear and Coherent  Speech  Volume:Normal  Handedness:Ambidextrous   Mood and Affect  Mood:Anxious  Affect:Appropriate   Thought Process  Thought Processes:Coherent  Descriptions of Associations:Circumstantial  Orientation:Full (Time, Place and Person)  Thought Content:Abstract Reasoning    Hallucinations:None  Ideas of Reference:None  Suicidal Thoughts:No  Homicidal Thoughts:No   Sensorium  Memory:Immediate Good  Judgment:Good  Insight:Good   Executive Functions  Concentration:Good  Attention Span:Fair  Recall:Good  Fund of Knowledge:Good  Language:Good   Psychomotor Activity  Psychomotor Activity:Normal   Assets  Assets:No data recorded  Sleep  Sleep:Fair  Number of hours: No data recorded  Nutritional Assessment (For OBS and FBC admissions only) Has the patient had a weight loss or gain of 10 pounds or more in the last 3 months?: No Has the patient had a decrease in food intake/or appetite?: No Does the patient have dental problems?: No Does the patient have eating habits or behaviors that may be indicators of an eating disorder including binging or inducing vomiting?: No Has the patient recently lost weight without trying?: 0 Has the patient been eating poorly because of a decreased appetite?: 0 Malnutrition Screening Tool Score: 0    Physical Exam: Physical Exam HENT:     Head: Normocephalic.     Nose: Nose normal.  Cardiovascular:     Rate and Rhythm: Normal rate.  Pulmonary:     Effort: Pulmonary effort is normal.  Musculoskeletal:        General: Normal range of motion.     Cervical back: Normal range of motion.  Skin:  General: Skin is warm.  Neurological:     General: No focal deficit present.     Mental Status: He is alert.  Psychiatric:        Thought Content: Thought content normal.   Review of Systems  Constitutional: Negative.   HENT: Negative.    Eyes: Negative.   Respiratory: Negative.    Cardiovascular: Negative.    Gastrointestinal: Negative.   Genitourinary: Negative.   Musculoskeletal: Negative.   Skin: Negative.   Neurological: Negative.   Endo/Heme/Allergies: Negative.   Psychiatric/Behavioral:  The patient is nervous/anxious.   Blood pressure (!) 141/98, pulse 74, temperature 98.5 F (36.9 C), temperature source Oral, resp. rate 18, SpO2 96 %. There is no height or weight on file to calculate BMI.  Musculoskeletal: Strength & Muscle Tone: within normal limits Gait & Station: normal Patient leans: N/A   [facility] MSE Discharge Disposition for Follow up and Recommendations: Based on my evaluation the patient does not appear to have an emergency medical condition and can be discharged with resources and follow up care in outpatient services for Medication Management   Sindy Guadeloupe, NP 04/25/2022, 8:56 PM

## 2023-03-16 ENCOUNTER — Encounter: Payer: Self-pay | Admitting: Family Medicine

## 2023-03-16 ENCOUNTER — Ambulatory Visit (INDEPENDENT_AMBULATORY_CARE_PROVIDER_SITE_OTHER): Payer: Medicaid Other | Admitting: Family Medicine

## 2023-03-16 VITALS — BP 114/62 | HR 89 | Temp 98.3°F | Ht 71.0 in | Wt 220.8 lb

## 2023-03-16 DIAGNOSIS — E669 Obesity, unspecified: Secondary | ICD-10-CM | POA: Diagnosis not present

## 2023-03-16 DIAGNOSIS — T1591XA Foreign body on external eye, part unspecified, right eye, initial encounter: Secondary | ICD-10-CM | POA: Diagnosis not present

## 2023-03-16 DIAGNOSIS — F1090 Alcohol use, unspecified, uncomplicated: Secondary | ICD-10-CM | POA: Diagnosis not present

## 2023-03-16 NOTE — Patient Instructions (Signed)
I will get some lab work and talk to you about it at your next visit in one month, unless I need to speak with you about it earlier.    I would like you to go to an optometrist's office to have them evaluate the object in your eye.  Call medicaid to ask them who is covered by your insurance.    Have a great day,   Dr. Constance Goltz

## 2023-03-16 NOTE — Progress Notes (Signed)
New Patient Office Visit  Subjective    Patient ID: Peter Escobar, male    DOB: 08/14/1981  Age: 42 y.o. MRN: [id number]  CC:  Chief Complaint  Patient presents with   Establish Care    HPI Peter Escobar presents to establish care   Eye pain -patient states he was playing with his daughter at the park when a piece of wood or leaves flew into his eye.  He got some of it out but feels like there is still some in there.  No blurred vision.  No double vision.  Just irritating.  Alcohol use disorder -patient has been sober for the past 3 months.  Drank heavily for the past 10 years and has consumed alcohol for 20 years in total.  He goes to [employer]  He used to drink a bottle of liquor a day.  When he drank heavily this did not even make him feel like he was drunk.  OCD/ADHD-currently receiving treatment for this with "Dr. Rickard Rhymes".  Also goes to a therapist.  Currently taking Focalin 20 mg in the morning.  Towards the end of the day it begins to reduce effectiveness and states his friends can tell the difference.  Dysphagia -after getting a referral to the ear nose and throat specialist patient never had this evaluated.  Still has issues with this.  Most often he gets dysphagia with solid foods such as steak or chicken.  Feels like the food "gets stuck in his throat.  He will then try to drink a lot of fluids to get it to go down sometimes he has to vomit.  It has happened about 3-4 times in the past months.  Patient had a "knot" in the back of his head above the hairline.  He says it is similar to the bump that was on his neck several years ago and states that that previous bump is still there.  He believes 1 that was on his head and most recently has now resolved.  Is no longer tender.  Tobacco use: < 1 ppd.  Trying to quit.   Alcohol use: sober x 3 months.   Drug use: None  Marital status: not married.  Has 6 kids.  Has custody of a few of them, shares custody with others.  One  of them lives out of state.   Employment: not working.     Outpatient Encounter Medications as of 03/16/2023  Medication Sig   dexmethylphenidate (FOCALIN XR) 20 MG 24 hr capsule Take 20 mg by mouth every morning.   No facility-administered encounter medications on file as of 03/16/2023.    Past Medical History:  Diagnosis Date   Anxiety     Past Surgical History:  Procedure Laterality Date   FRACTURE SURGERY      Family History  Problem Relation Age of Onset   Cancer Other    Diabetes Maternal Grandmother     Social History   Socioeconomic History   Marital status: Single    Spouse name: Not on file   Number of children: Not on file   Years of education: Not on file   Highest education level: Not on file  Occupational History   Not on file  Tobacco Use   Smoking status: Every Day    Packs/day: 0.50    Years: 14.00    Additional pack years: 0.00    Total pack years: 7.00    Types: Cigarettes   Smokeless tobacco: Never  Substance and  Sexual Activity   Alcohol use: Yes    Comment: pt states that he cannot determine how much he drinks per week   Drug use: Not Currently   Sexual activity: Yes    Birth control/protection: Surgical  Other Topics Concern   Not on file  Social History Narrative   Not on file   Social Determinants of Health   Financial Resource Strain: Not on file  Food Insecurity: Not on file  Transportation Needs: Not on file  Physical Activity: Not on file  Stress: Not on file  Social Connections: Not on file  Intimate Partner Violence: Not on file    ROS      Objective    BP 114/62   Pulse 89   Temp 98.3 F (36.8 C) (Oral)   Ht  (1.803 m)   Wt 220 lb 12 oz (100.1 kg)   SpO2 99%   BMI 30.79 kg/m   Physical Exam General: Alert and oriented.  No acute distress HEENT: On the right eye where the conjunctiva meets the carbuncle medially there is a nonpenetrating small object appears consistent with his description of  relief getting in his eye. CV: Regular rate and rhythm Pulmonary: Lungs clear bilaterally GI: Soft, nontender to palpation. Psych: Pleasant affect.  Talkative.      Assessment & Plan:   Problem List Items Addressed This Visit       Other   Alcohol use disorder - Primary   Relevant Orders   Comp Met (CMET) (Completed)   Eye foreign body, right, initial encounter    Patient had complaint of "stick" in his eye causing irritation.  No complaint of vision changes.  On exam there was what appeared to be a small piece of pine needle or bark in the medial corner of his right eye.  It was nonpenetrating.  Tried to gently remove it with cotton swab but was unsuccessful.  Recommended patient call Medicaid to get list of optometrist/ophthalmologist who will take his insurance and have them evaluated.      Obesity (BMI 30-39.9)   Relevant Medications   dexmethylphenidate (FOCALIN XR) 20 MG 24 hr capsule   Other Relevant Orders   Lipid Profile (Completed)   HgB A1c (Completed)    Return in about 4 weeks (around 04/13/2023) for lab followup, weight.  Sandre Kitty, MD

## 2023-03-17 DIAGNOSIS — F1090 Alcohol use, unspecified, uncomplicated: Secondary | ICD-10-CM | POA: Insufficient documentation

## 2023-03-17 DIAGNOSIS — E669 Obesity, unspecified: Secondary | ICD-10-CM | POA: Insufficient documentation

## 2023-03-17 DIAGNOSIS — T1591XA Foreign body on external eye, part unspecified, right eye, initial encounter: Secondary | ICD-10-CM | POA: Insufficient documentation

## 2023-03-17 LAB — COMPREHENSIVE METABOLIC PANEL
ALT: 23 IU/L (ref 0–44)
AST: 24 IU/L (ref 0–40)
Albumin/Globulin Ratio: 2 (ref 1.2–2.2)
Albumin: 3.8 g/dL — ABNORMAL LOW (ref 4.1–5.1)
Alkaline Phosphatase: 56 IU/L (ref 44–121)
BUN/Creatinine Ratio: 10 (ref 9–20)
BUN: 9 mg/dL (ref 6–20)
Bilirubin Total: <0.2 mg/dL (ref 0.0–1.2)
CO2: 19 mmol/L — ABNORMAL LOW (ref 20–29)
Calcium: 8.8 mg/dL (ref 8.7–10.2)
Chloride: 107 mmol/L — ABNORMAL HIGH (ref 96–106)
Creatinine, Ser: 0.94 mg/dL (ref 0.76–1.27)
Globulin, Total: 1.9 g/dL (ref 1.5–4.5)
Glucose: 107 mg/dL — ABNORMAL HIGH (ref 70–99)
Potassium: 4.5 mmol/L (ref 3.5–5.2)
Sodium: 141 mmol/L (ref 134–144)
Total Protein: 5.7 g/dL — ABNORMAL LOW (ref 6.0–8.5)
eGFR: 106 mL/min/{1.73_m2} (ref >59)

## 2023-03-17 LAB — LIPID PANEL
Chol/HDL Ratio: 4 ratio (ref 0.0–5.0)
Cholesterol, Total: 139 mg/dL (ref 100–199)
HDL: 35 mg/dL — ABNORMAL LOW (ref >39)
LDL Chol Calc (NIH): 87 mg/dL (ref 0–99)
Triglycerides: 90 mg/dL (ref 0–149)
VLDL Cholesterol Cal: 17 mg/dL (ref 5–40)

## 2023-03-17 LAB — HEMOGLOBIN A1C
Est. average glucose Bld gHb Est-mCnc: 105 mg/dL
Hgb A1c MFr Bld: 5.3 % (ref 4.8–5.6)

## 2023-03-17 NOTE — Assessment & Plan Note (Signed)
Patient had complaint of "stick" in his eye causing irritation.  No complaint of vision changes.  On exam there was what appeared to be a small piece of pine needle or bark in the medial corner of his right eye.  It was nonpenetrating.  Tried to gently remove it with cotton swab but was unsuccessful.  Recommended patient call Medicaid to get list of optometrist/ophthalmologist who will take his insurance and have them evaluated.

## 2023-04-18 ENCOUNTER — Encounter: Payer: Self-pay | Admitting: Family Medicine

## 2023-04-18 ENCOUNTER — Ambulatory Visit (INDEPENDENT_AMBULATORY_CARE_PROVIDER_SITE_OTHER): Payer: Medicaid Other | Admitting: Family Medicine

## 2023-04-18 VITALS — BP 110/64 | HR 80 | Temp 98.7°F | Ht 71.0 in | Wt 205.0 lb

## 2023-04-18 DIAGNOSIS — Z23 Encounter for immunization: Secondary | ICD-10-CM | POA: Diagnosis not present

## 2023-04-18 DIAGNOSIS — F1090 Alcohol use, unspecified, uncomplicated: Secondary | ICD-10-CM

## 2023-04-18 DIAGNOSIS — R1310 Dysphagia, unspecified: Secondary | ICD-10-CM | POA: Diagnosis not present

## 2023-04-18 DIAGNOSIS — E669 Obesity, unspecified: Secondary | ICD-10-CM | POA: Diagnosis not present

## 2023-04-18 DIAGNOSIS — Z720 Tobacco use: Secondary | ICD-10-CM

## 2023-04-18 NOTE — Patient Instructions (Signed)
It was nice to see you today,  Your lab work has improved.  Please continue exercising and eating healthy.  Please follow-up with the ear nose and throat doctor.  I would like to see back in 3 months.  Continue to try to quit smoking.  Have a great day,  [name], MD

## 2023-04-18 NOTE — Assessment & Plan Note (Signed)
Continues to be abstinent from alcohol.  Encouraged continued abstinence.

## 2023-04-18 NOTE — Assessment & Plan Note (Signed)
Patient still has not yet seen the ear nose and throat doctor but has had on his list of things to do.  Plans to get it done within the next month.  Still having symptoms of difficulty swallowing.  Not worse or better.

## 2023-04-18 NOTE — Assessment & Plan Note (Signed)
Patient has been doing a good job of eating healthier, exercising, drinking plenty of water and losing weight.  Encouraged him to continue his efforts.

## 2023-04-18 NOTE — Progress Notes (Signed)
   Established Patient Office Visit  Subjective   Patient ID: Peter Escobar, male    DOB: 06-12-1981  Age: 42 y.o. MRN: [id number]  Chief Complaint  Patient presents with   Follow Up Labs   Discuss Weight     Patient states he is eating healthier including drinking more water, eating salads, fruits, taking vitamins.  Also running daily now up to 2 miles a day.  Discussed patient's weight loss and encouraged him to continue these habits.  Discussed his lab work with him and his improved LFTs.  Advised him to continue alcohol cessation.  He endorsed continued abstinence.  Patient still trying to quit smoking has not completely quit.  Maybe smokes a pack a week.  Patient has not yet seen the ENT but has seen the eye doctor and is also seeing a dentist.  States the dentist told him his teeth were loose and referred him to a orthodontic specialist.       ROS    Objective:     BP 110/64   Pulse 80   Temp 98.7 F (37.1 C) (Oral)   Ht 5\' 11"  (1.803 m)   Wt 205 lb (93 kg)   SpO2 97%   BMI 28.59 kg/m    Physical Exam General: Alert, oriented Pulmonary: No respiratory distress Psych: Pleasant affect, talkative.  No results found for any visits on 04/18/23.    The ASCVD Risk score (Arnett DK, et al., 2019) failed to calculate for the following reasons:   The 2019 ASCVD risk score is only valid for ages 31 to 41    Assessment & Plan:   Problem List Items Addressed This Visit       Digestive   Dysphagia    Patient still has not yet seen the ear nose and throat doctor but has had on his list of things to do.  Plans to get it done within the next month.  Still having symptoms of difficulty swallowing.  Not worse or better.        Other   Alcohol use disorder    Continues to be abstinent from alcohol.  Encouraged continued abstinence.      Obesity (BMI 30-39.9)    Patient has been doing a good job of eating healthier, exercising, drinking plenty of water and  losing weight.  Encouraged him to continue his efforts.      Tobacco use    Continues to try to quit.  Is down to a few cigarettes a day.  Will also use nicotine e-cigarettes to help with cessation.  Follow-up at next visit.      Other Visit Diagnoses     Vaccine for diphtheria-tetanus-pertussis, combined    -  Primary   Relevant Orders   Tdap vaccine greater than or equal to 7yo IM (Completed)       Return in about 3 months (around 07/19/2023) for Smoking, dysphagia.    Sandre Kitty, MD

## 2023-04-18 NOTE — Assessment & Plan Note (Signed)
Continues to try to quit.  Is down to a few cigarettes a day.  Will also use nicotine e-cigarettes to help with cessation.  Follow-up at next visit.

## 2023-07-19 ENCOUNTER — Ambulatory Visit: Payer: Medicaid Other | Admitting: Family Medicine

## 2023-07-19 NOTE — Progress Notes (Deleted)
Established Patient Office Visit  Subjective   Patient ID: Peter Escobar, male    DOB: 22-Jul-1981  Age: 42 y.o. MRN: [id number]  No chief complaint on file.   HPI - Diet, exercise?  Smoking cessation?  Dysphagia-ENT?   The ASCVD Risk score (Arnett DK, et al., 2019) failed to calculate for the following reasons:   The 2019 ASCVD risk score is only valid for ages 80 to 32  Health Maintenance Due  Topic Date Due   COVID-19 Vaccine (1) Never done   HIV Screening  Never done   Hepatitis C Screening  Never done   INFLUENZA VACCINE  06/29/2023      Objective:     There were no vitals taken for this visit. {Vitals History (Optional):23777}  Physical Exam   No results found for any visits on 07/19/23.      Assessment & Plan:   There are no diagnoses linked to this encounter.   No follow-ups on file.    Sandre Kitty, MD

## 2023-10-23 ENCOUNTER — Other Ambulatory Visit (HOSPITAL_COMMUNITY): Payer: Self-pay

## 2023-10-23 MED ORDER — DEXMETHYLPHENIDATE HCL ER 30 MG PO CP24
30.0000 mg | ORAL_CAPSULE | Freq: Every morning | ORAL | 0 refills | Status: DC
  Filled 2023-10-23: qty 30, 30d supply, fill #0

## 2023-10-25 ENCOUNTER — Other Ambulatory Visit (HOSPITAL_COMMUNITY): Payer: Self-pay

## 2023-12-14 ENCOUNTER — Other Ambulatory Visit (HOSPITAL_COMMUNITY): Payer: Self-pay

## 2023-12-14 MED ORDER — DEXMETHYLPHENIDATE HCL ER 30 MG PO CP24
30.0000 mg | ORAL_CAPSULE | Freq: Every morning | ORAL | 0 refills | Status: DC
  Filled 2023-12-14: qty 30, 30d supply, fill #0

## 2024-01-10 ENCOUNTER — Other Ambulatory Visit (HOSPITAL_COMMUNITY): Payer: Self-pay

## 2024-01-10 MED ORDER — DEXMETHYLPHENIDATE HCL ER 30 MG PO CP24
30.0000 mg | ORAL_CAPSULE | Freq: Every morning | ORAL | 0 refills | Status: DC
  Filled 2024-01-12: qty 30, 30d supply, fill #0

## 2024-01-12 ENCOUNTER — Other Ambulatory Visit (HOSPITAL_COMMUNITY): Payer: Self-pay

## 2024-02-09 ENCOUNTER — Ambulatory Visit: Payer: Self-pay | Admitting: Family Medicine

## 2024-02-09 NOTE — Telephone Encounter (Signed)
 Chief Complaint: hemoptysis Symptoms: hemoptysis, hematuria, bloody stools, chest pain Frequency: "for a while", getting worse Pertinent Negatives: Patient denies SOB at rest, dizziness, lightheadedness Disposition: [x] ED /[] Urgent Care (no appt availability in office) / [] Appointment(In office/virtual)/ []  [facility]/ [] Home Care/ [x] Refused Recommended Disposition /[] [facility]/ []  Follow-up with PCP Additional Notes: Fiance Roxana Hires calls on behalf of pt. Not on the DPR but RN got permission from the pt to speak with Byrd Hesselbach about his PHI.   Byrd Hesselbach states pt has been coughing up blood and has blood in his urine and stool. Byrd Hesselbach states he has been coughing up blood "for a while" but that his mucus used to be "speckled" with blood. Lately he has been coughing up bright red bloody mucus. Byrd Hesselbach states pt spent 45 minutes coughing up blood that covered the surface of the sink. States there is blood in his stools. Denies that pt lost a large amount of blood or clots via his rectum. Also endorses that pt has hematuria. States pt is fatigued. States pt is having intermittent "pinching" pain in the left side of his chest and panic attacks. Denies blood thinners. States pt was supposed to see an ENT but they recently had a baby and have been busy. Byrd Hesselbach is a paramedic and has been trying to urge the pt to go to the ED. RN advised Byrd Hesselbach that pt does indeed need to go to the ED. RN educated Byrd Hesselbach on why the ED is the safest place for the pt at this time and she verbalized understanding. Byrd Hesselbach said she will try take the pt to the ED. RN advised Byrd Hesselbach if pt has SOB, CP, loses a large amount of blood she needs to call 911. She verbalized understanding.  RN called the [facility] but office closed.    Copied from CRM [id number]. Topic: Clinical - Red Word Triage >> Feb 09, 2024  3:58 PM Gery Pray wrote: Red Word that prompted transfer to Nurse Triage: Fiance Ms. Karie Georges stating that patient is coughing up  blood, blood in his stool, and while urinating. She states that he has intermittent chest pains with an increase in panic attacks. Patient has been experiencing this for 10 days with 3 days ago being the worse. Patient coughed up blood for 40 mins straight. Reason for Disposition  Chest pain  Answer Assessment - Initial Assessment Questions 1. ONSET: "When did the cough begin?"      Coughing up blood "for a while", doctor wanted him to see ENT and throat specialist which got pushed back because pt just had a baby. 2. SEVERITY: "How bad is the cough today?" "Did the blood appear after a coughing spell?"      Bright red mucus-y blood. "He coughs every AM when he gets up" 3. SPUTUM: "Describe the color of your sputum" (none, dry cough; clear, white, yellow, green)     Red 5. DIFFICULTY BREATHING: "Are you having difficulty breathing?" If Yes, ask: "How bad is it?" (e.g., mild, moderate, severe)    - MILD: No SOB at rest, mild SOB with walking, speaks normally in sentences, can lie down, no retractions, pulse < 100.    - MODERATE: SOB at rest, SOB with minimal exertion and prefers to sit, cannot lie down flat, speaks in phrases, mild retractions, audible wheezing, pulse 100-120.    - SEVERE: Very SOB at rest, speaks in single words, struggling to breathe, sitting hunched forward, retractions, pulse > 120      "He  got winded going up the steps to court last week", denies SOB at rest "except when he is having a panic attack" 6. FEVER: "Do you have a fever?" If Yes, ask: "What is your temperature, how was it measured, and when did it start?"     No 7. CARDIAC HISTORY: "Do you have any history of heart disease?" (e.g., heart attack, congestive heart failure)      None 8. LUNG HISTORY: "Do you have any history of lung disease?"  (e.g., pulmonary embolus, asthma, emphysema)     None  9. PE RISK FACTORS: "Do you have a history of blood clots?" (or: recent major surgery, recent prolonged travel,  bedridden)     No 10. OTHER SYMPTOMS: "Do you have any other symptoms?" (e.g., runny nose, wheezing, chest pain)       Blood in stool 2 months ago, hematuria last week. Now blood is in cough, stool (blood is mixed in), urine. Bright red thick "mucousy" blood. Pt "filled" the sink surface with mucusy blood and coughed blood for 45 mins. Pt "broke" both sides of his face. Usually mucus is "speckled" with blood but now mucus is all blood. Significant other got her tubes tied this AM. Endorses "pinching" pain in the left side of chest. "He was supposed to get his throat stretched and get a cancer screening", too busy with kids. Pt fatigued, in bed, and having panic attacks. Used to be 1x month, now 4-5x panic attacks in the last few days. "Feels like his brain is not getting enough oxygen." Takes ibuprofen or tylenol "occasionally."  Protocols used: Coughing Up Blood-A-AH

## 2024-02-13 ENCOUNTER — Other Ambulatory Visit (HOSPITAL_COMMUNITY): Payer: Self-pay

## 2024-02-13 MED ORDER — DEXMETHYLPHENIDATE HCL ER 30 MG PO CP24
30.0000 mg | ORAL_CAPSULE | Freq: Every morning | ORAL | 0 refills | Status: DC
  Filled 2024-02-13 – 2024-03-05 (×2): qty 30, 30d supply, fill #0

## 2024-02-23 ENCOUNTER — Other Ambulatory Visit (HOSPITAL_COMMUNITY): Payer: Self-pay

## 2024-03-05 ENCOUNTER — Other Ambulatory Visit (HOSPITAL_COMMUNITY): Payer: Self-pay

## 2024-04-01 ENCOUNTER — Other Ambulatory Visit (HOSPITAL_COMMUNITY): Payer: Self-pay

## 2024-04-01 MED ORDER — DEXMETHYLPHENIDATE HCL ER 30 MG PO CP24
30.0000 mg | ORAL_CAPSULE | Freq: Every morning | ORAL | 0 refills | Status: DC
  Filled 2024-04-01 – 2024-04-02 (×2): qty 30, 30d supply, fill #0

## 2024-04-02 ENCOUNTER — Other Ambulatory Visit (HOSPITAL_COMMUNITY): Payer: Self-pay

## 2024-05-10 ENCOUNTER — Other Ambulatory Visit (HOSPITAL_COMMUNITY): Payer: Self-pay

## 2024-05-10 MED ORDER — DEXMETHYLPHENIDATE HCL ER 30 MG PO CP24
30.0000 mg | ORAL_CAPSULE | Freq: Every morning | ORAL | 0 refills | Status: AC
  Filled 2024-05-10: qty 30, 30d supply, fill #0

## 2024-07-31 ENCOUNTER — Other Ambulatory Visit (HOSPITAL_COMMUNITY): Payer: Self-pay

## 2024-07-31 MED ORDER — AMPHETAMINE-DEXTROAMPHET ER 15 MG PO CP24
15.0000 mg | ORAL_CAPSULE | Freq: Every morning | ORAL | 0 refills | Status: DC
  Filled 2024-07-31: qty 30, 30d supply, fill #0

## 2024-09-02 ENCOUNTER — Other Ambulatory Visit (HOSPITAL_COMMUNITY): Payer: Self-pay

## 2024-09-13 ENCOUNTER — Other Ambulatory Visit (HOSPITAL_COMMUNITY): Payer: Self-pay

## 2024-09-13 MED ORDER — AMPHETAMINE-DEXTROAMPHET ER 15 MG PO CP24
15.0000 mg | ORAL_CAPSULE | Freq: Every morning | ORAL | 0 refills | Status: DC
  Filled 2024-09-13: qty 30, 30d supply, fill #0

## 2024-11-12 ENCOUNTER — Other Ambulatory Visit (HOSPITAL_COMMUNITY): Payer: Self-pay

## 2024-11-12 MED ORDER — AMPHETAMINE-DEXTROAMPHET ER 15 MG PO CP24
15.0000 mg | ORAL_CAPSULE | Freq: Every morning | ORAL | 0 refills | Status: AC
  Filled 2024-11-12: qty 30, 30d supply, fill #0

## 2024-11-13 ENCOUNTER — Other Ambulatory Visit (HOSPITAL_COMMUNITY): Payer: Self-pay

## 2024-11-16 ENCOUNTER — Other Ambulatory Visit (HOSPITAL_COMMUNITY): Payer: Self-pay

## 2024-11-18 ENCOUNTER — Other Ambulatory Visit (HOSPITAL_COMMUNITY): Payer: Self-pay

## 2024-11-18 MED ORDER — AMPHETAMINE-DEXTROAMPHET ER 15 MG PO CP24
15.0000 mg | ORAL_CAPSULE | Freq: Every morning | ORAL | 0 refills | Status: AC
  Filled 2024-11-18: qty 30, 30d supply, fill #0

## 2024-12-13 ENCOUNTER — Other Ambulatory Visit (HOSPITAL_COMMUNITY): Payer: Self-pay

## 2024-12-13 ENCOUNTER — Other Ambulatory Visit: Payer: Self-pay

## 2024-12-13 MED ORDER — AMPHETAMINE-DEXTROAMPHET ER 15 MG PO CP24
15.0000 mg | ORAL_CAPSULE | Freq: Every morning | ORAL | 0 refills | Status: AC
  Filled 2024-12-13: qty 30, 30d supply, fill #0

## 2024-12-19 ENCOUNTER — Other Ambulatory Visit (HOSPITAL_COMMUNITY): Payer: Self-pay
